# Patient Record
Sex: Male | Born: 1955 | Race: White | Hispanic: No | Marital: Married | Smoking: Never smoker
Health system: Southern US, Community
[De-identification: ages and names within clinical notes are randomized; demographics above are authoritative.]

## PROBLEM LIST (undated history)

## (undated) DIAGNOSIS — M8580 Other specified disorders of bone density and structure, unspecified site: Secondary | ICD-10-CM

## (undated) DIAGNOSIS — M109 Gout, unspecified: Secondary | ICD-10-CM

## (undated) DIAGNOSIS — H409 Unspecified glaucoma: Secondary | ICD-10-CM

## (undated) DIAGNOSIS — G709 Myoneural disorder, unspecified: Secondary | ICD-10-CM

## (undated) DIAGNOSIS — N289 Disorder of kidney and ureter, unspecified: Secondary | ICD-10-CM

## (undated) DIAGNOSIS — L570 Actinic keratosis: Secondary | ICD-10-CM

## (undated) DIAGNOSIS — T7840XA Allergy, unspecified, initial encounter: Secondary | ICD-10-CM

## (undated) DIAGNOSIS — Z5189 Encounter for other specified aftercare: Secondary | ICD-10-CM

## (undated) DIAGNOSIS — Q613 Polycystic kidney, unspecified: Secondary | ICD-10-CM

## (undated) DIAGNOSIS — M859 Disorder of bone density and structure, unspecified: Secondary | ICD-10-CM

## (undated) DIAGNOSIS — I10 Essential (primary) hypertension: Secondary | ICD-10-CM

## (undated) DIAGNOSIS — H269 Unspecified cataract: Secondary | ICD-10-CM

## (undated) DIAGNOSIS — C801 Malignant (primary) neoplasm, unspecified: Secondary | ICD-10-CM

## (undated) DIAGNOSIS — K429 Umbilical hernia without obstruction or gangrene: Secondary | ICD-10-CM

## (undated) DIAGNOSIS — K219 Gastro-esophageal reflux disease without esophagitis: Secondary | ICD-10-CM

## (undated) DIAGNOSIS — R011 Cardiac murmur, unspecified: Secondary | ICD-10-CM

## (undated) DIAGNOSIS — E785 Hyperlipidemia, unspecified: Secondary | ICD-10-CM

## (undated) HISTORY — PX: KIDNEY TRANSPLANT: SHX239

## (undated) HISTORY — DX: Unspecified glaucoma: H40.9

## (undated) HISTORY — DX: Umbilical hernia without obstruction or gangrene: K42.9

## (undated) HISTORY — PX: NEPHRECTOMY TRANSPLANTED ORGAN: SUR880

## (undated) HISTORY — DX: Hypomagnesemia: E83.42

## (undated) HISTORY — DX: Gout, unspecified: M10.9

## (undated) HISTORY — PX: CHOLECYSTECTOMY: SHX55

## (undated) HISTORY — DX: Hyperlipidemia, unspecified: E78.5

## (undated) HISTORY — DX: Myoneural disorder, unspecified: G70.9

## (undated) HISTORY — PX: AV FISTULA PLACEMENT: SHX1204

## (undated) HISTORY — DX: Other specified disorders of bone density and structure, unspecified site: M85.80

## (undated) HISTORY — DX: Polycystic kidney, unspecified: Q61.3

## (undated) HISTORY — DX: Unspecified cataract: H26.9

## (undated) HISTORY — PX: BOWEL RESECTION: SHX1257

## (undated) HISTORY — DX: Disorder of bone density and structure, unspecified: M85.9

## (undated) HISTORY — DX: Cardiac murmur, unspecified: R01.1

## (undated) HISTORY — DX: Gastro-esophageal reflux disease without esophagitis: K21.9

## (undated) HISTORY — PX: SMALL INTESTINE SURGERY: SHX150

## (undated) HISTORY — DX: Allergy, unspecified, initial encounter: T78.40XA

## (undated) HISTORY — DX: Essential (primary) hypertension: I10

## (undated) HISTORY — PX: EYE SURGERY: SHX253

## (undated) HISTORY — DX: Malignant (primary) neoplasm, unspecified: C80.1

## (undated) HISTORY — DX: Encounter for other specified aftercare: Z51.89

## (undated) HISTORY — DX: Actinic keratosis: L57.0

---

## 2002-03-15 ENCOUNTER — Inpatient Hospital Stay (HOSPITAL_COMMUNITY): Admission: AD | Admit: 2002-03-15 | Discharge: 2002-03-16 | Payer: Self-pay | Admitting: Family Medicine

## 2002-03-16 ENCOUNTER — Encounter: Payer: Self-pay | Admitting: Family Medicine

## 2002-03-16 ENCOUNTER — Encounter: Payer: Self-pay | Admitting: Cardiology

## 2003-11-30 ENCOUNTER — Ambulatory Visit (HOSPITAL_COMMUNITY): Admission: RE | Admit: 2003-11-30 | Discharge: 2003-11-30 | Payer: Self-pay | Admitting: Family Medicine

## 2004-10-15 ENCOUNTER — Ambulatory Visit (HOSPITAL_COMMUNITY): Admission: RE | Admit: 2004-10-15 | Discharge: 2004-10-15 | Payer: Self-pay | Admitting: Nephrology

## 2004-11-13 ENCOUNTER — Ambulatory Visit: Payer: Self-pay | Admitting: Cardiology

## 2004-12-04 ENCOUNTER — Ambulatory Visit: Payer: Self-pay

## 2005-02-20 ENCOUNTER — Ambulatory Visit (HOSPITAL_COMMUNITY): Admission: RE | Admit: 2005-02-20 | Discharge: 2005-02-20 | Payer: Self-pay | Admitting: Vascular Surgery

## 2006-03-18 ENCOUNTER — Ambulatory Visit (HOSPITAL_COMMUNITY): Admission: RE | Admit: 2006-03-18 | Discharge: 2006-03-18 | Payer: Self-pay | Admitting: Nephrology

## 2006-11-24 ENCOUNTER — Ambulatory Visit: Payer: Self-pay

## 2006-11-24 ENCOUNTER — Encounter (INDEPENDENT_AMBULATORY_CARE_PROVIDER_SITE_OTHER): Payer: Self-pay | Admitting: Critical Care Medicine

## 2006-11-24 ENCOUNTER — Encounter: Payer: Self-pay | Admitting: Cardiovascular Disease

## 2006-11-26 ENCOUNTER — Ambulatory Visit: Payer: Self-pay | Admitting: Gastroenterology

## 2006-12-16 ENCOUNTER — Encounter: Payer: Self-pay | Admitting: Gastroenterology

## 2006-12-16 ENCOUNTER — Ambulatory Visit: Payer: Self-pay | Admitting: Gastroenterology

## 2008-09-18 ENCOUNTER — Emergency Department (HOSPITAL_COMMUNITY): Admission: EM | Admit: 2008-09-18 | Discharge: 2008-09-18 | Payer: Self-pay | Admitting: Emergency Medicine

## 2008-10-24 ENCOUNTER — Ambulatory Visit (HOSPITAL_COMMUNITY): Admission: RE | Admit: 2008-10-24 | Discharge: 2008-10-24 | Payer: Self-pay | Admitting: Nephrology

## 2009-01-30 ENCOUNTER — Encounter: Admission: RE | Admit: 2009-01-30 | Discharge: 2009-01-30 | Payer: Self-pay | Admitting: Nephrology

## 2009-08-21 ENCOUNTER — Encounter: Payer: Self-pay | Admitting: Cardiology

## 2009-08-22 ENCOUNTER — Encounter: Payer: Self-pay | Admitting: Cardiology

## 2009-10-18 DIAGNOSIS — I10 Essential (primary) hypertension: Secondary | ICD-10-CM | POA: Insufficient documentation

## 2009-10-18 DIAGNOSIS — K219 Gastro-esophageal reflux disease without esophagitis: Secondary | ICD-10-CM | POA: Insufficient documentation

## 2009-10-18 DIAGNOSIS — E785 Hyperlipidemia, unspecified: Secondary | ICD-10-CM | POA: Insufficient documentation

## 2009-10-18 DIAGNOSIS — M109 Gout, unspecified: Secondary | ICD-10-CM | POA: Insufficient documentation

## 2009-10-19 ENCOUNTER — Ambulatory Visit: Payer: Self-pay | Admitting: Cardiology

## 2009-10-19 ENCOUNTER — Encounter (INDEPENDENT_AMBULATORY_CARE_PROVIDER_SITE_OTHER): Payer: Self-pay | Admitting: *Deleted

## 2009-10-19 DIAGNOSIS — R0602 Shortness of breath: Secondary | ICD-10-CM | POA: Insufficient documentation

## 2009-10-19 DIAGNOSIS — R002 Palpitations: Secondary | ICD-10-CM | POA: Insufficient documentation

## 2009-10-30 ENCOUNTER — Ambulatory Visit: Payer: Self-pay | Admitting: Cardiology

## 2009-10-30 ENCOUNTER — Encounter: Payer: Self-pay | Admitting: Cardiology

## 2009-11-08 ENCOUNTER — Ambulatory Visit: Payer: Self-pay | Admitting: Cardiology

## 2009-11-21 ENCOUNTER — Ambulatory Visit: Payer: Self-pay | Admitting: Cardiology

## 2009-11-23 ENCOUNTER — Ambulatory Visit: Payer: Self-pay | Admitting: Cardiology

## 2009-11-23 ENCOUNTER — Encounter: Payer: Self-pay | Admitting: Cardiology

## 2009-11-29 ENCOUNTER — Encounter: Payer: Self-pay | Admitting: Cardiology

## 2010-01-29 ENCOUNTER — Encounter: Payer: Self-pay | Admitting: Cardiology

## 2010-06-01 ENCOUNTER — Encounter: Payer: Self-pay | Admitting: Cardiology

## 2010-09-08 ENCOUNTER — Encounter: Payer: Self-pay | Admitting: Nephrology

## 2010-09-18 NOTE — Assessment & Plan Note (Signed)
Summary: f/u cardionet [place]   Visit Type:  Follow-up Primary Provider:  Dr. Fayrene Fearing Deterding  CC:  Dyspnea and dizziness.  History of Present Illness: The patient presents for followup of the above. Since I last saw him he continues to have the same complaints of dizziness particularly when he does something like climb a flight of stairs.  He feels palpitations with this type of activity but has had no presyncope or syncope. He gets dyspnea more than he thinks he should with exertion. He does not get chest pressure, neck or arm discomfort. He does have some lower extremity swelling perhaps more than when I last saw him.  He did have an exercise treadmill test and I reviewed this with him. He had a reasonable exercise tolerance though he had an accelerated blood pressure response. There were no ischemic ST-T wave changes. I also reviewed an event monitor which she is currently wearing. He Reece Leader has sinus rhythm. He did not have any symptoms while wearing this. It was a short run of nonsustained ventricular tachycardia which apparently was asymptomatic.  Preventive Screening-Counseling & Management  Alcohol-Tobacco     Smoking Status: never  Current Medications (verified): 1)  Prograf 1 Mg Caps (Tacrolimus) .... Take 1 Capsule By Mouth At Bedtime 2)  Cellcept 250 Mg Caps (Mycophenolate Mofetil) .... Take 1 Capsule By Mouth Two Times A Day 3)  Prednisone 5 Mg Tabs (Prednisone) .... Take 2 Tablets By Mouth Once Daily 4)  Aspirin 81 Mg Tbec (Aspirin) .... Take One Tablet By Mouth Daily 5)  Furosemide 20 Mg Tabs (Furosemide) .... Take 2 Tablets By Mouth Daily. 6)  Nexium 40 Mg Cpdr (Esomeprazole Magnesium) .... Take 1 Tablet By Mouth Once A Day 7)  Allopurinol 300 Mg Tabs (Allopurinol) .... Take 1 Tablet By Mouth Once A Day 8)  Lipitor 20 Mg Tabs (Atorvastatin Calcium) .... Take One Tablet By Mouth Daily. 9)  Calcitriol 0.5 Mcg Caps (Calcitriol) .... Take 1 Tablet By Mouth Once A Day 10)   Fosamax 70 Mg Tabs (Alendronate Sodium) .... Take 1 Tablet By Mouth Once Per Week 11)  Amlodipine Besylate 5 Mg Tabs (Amlodipine Besylate) .... Take One Tablet By Mouth Daily 12)  Vicodin Es 7.5-750 Mg Tabs (Hydrocodone-Acetaminophen) .... Take 1 Tablet Every 4 Hours As Needed Pain 13)  Prograf 0.5 Mg Caps (Tacrolimus) .... Take 1capsule By Mouth Every Morning  Allergies: 1)  Augmentin (Amoxicillin-Pot Clavulanate)  Comments:  Nurse/Medical Assistant: The patient's medications were reviewed with the patient and were updated in the Medication List. Pt brought a list of medications to office visit.  Cyril Loosen, RN, BSN (November 21, 2009 10:04 AM)  Past History:  Past Medical History: Reviewed history from 10/19/2009 and no changes required. Hypertension x years GERD Hyperlipidemia x years Gout Polycystic kidney diease Low bone density Hypomagnesemia Umbilical hernia Keratosis on the face  Past Surgical History: Reviewed history from 10/19/2009 and no changes required. Renal transplant (cadaver) AV fistula  Review of Systems       As stated in the HPI and negative for all other systems.   Vital Signs:  Patient profile:   55 year old male Height:      72 inches Weight:      209.25 pounds Pulse rate:   73 / minute BP sitting:   152 / 79  (left arm) Cuff size:   regular  Vitals Entered By: Cyril Loosen, RN, BSN (November 21, 2009 10:00 AM) CC: Dyspnea, dizziness Comments  Follow up visit. Pt wearing Cardionet monitor.    Physical Exam  General:  Well developed, well nourished, in no acute distress. Head:  normocephalic and atraumatic Eyes:  PERRLA/EOM intact; conjunctiva and lids normal. Mouth:  Edentulous. Oral mucosa normal. Neck:  Neck supple, no JVD. No masses, thyromegaly or abnormal cervical nodes. Chest Wall:  no deformities or breast masses noted Lungs:  Clear bilaterally to auscultation and percussion. Abdomen:  Bowel sounds positive; abdomen soft and  non-tender no hernias noted. No hepatosplenomegaly, bilateral palpable kidneys! Msk:  Back normal, normal gait. Muscle strength and tone normal. Extremities:  No clubbing or cyanosis. Neurologic:  Alert and oriented x 3. Skin:  Intact without lesions or rashes. Psych:  Normal affect.   Detailed Cardiovascular Exam  Neck    Carotids: Carotids full and equal bilaterally without bruits.      Neck Veins: Normal, no JVD.    Heart    Inspection: no deformities or lifts noted.      Palpation: normal PMI with no thrills palpable.      Auscultation: regular rate and rhythm, S1, S2 without murmurs, rubs, gallops, or clicks.    Vascular    Abdominal Aorta: no palpable masses, pulsations, or audible bruits.      Femoral Pulses: normal femoral pulses bilaterally.      Pedal Pulses: normal pedal pulses bilaterally.      Radial Pulses: normal radial pulses bilaterally.      Peripheral Circulation: no clubbing, cyanosis, moderate bilateral edema to mid calf   Impression & Recommendations:  Problem # 1:  DYSPNEA (ICD-786.05) The etiology of this is still not clear. He was not particularly symptomatic on the treadmill though he said he was for a while afterwards. He recently had his Lasix increased because of edema and increased blood pressure. He also has been noted to have an elevated TSH. This is going to be addressed by his nephrologist. Maryclare Labrador see if this change in therapy or potential treatment of thyroid disorder helps. Because of the dyspnea and the ectopy on the monitor I will order an echocardiogram particularly looking for LVH with diastolic dysfunction as I don't suspect systolic dysfunction. Orders: 2-D Echocardiogram (2D Echo)  Problem # 2:  PALPITATIONS, RECURRENT (ICD-785.1) He will continue to wear the event monitor. With the ventricular arrhythmias I might change medical therapy or suggest further evaluation if there are any abnormalities on the echo. For now he will remain on the  meds as listed. Orders: 2-D Echocardiogram (2D Echo)  Problem # 3:  HYPERTENSION (ICD-401.9) His blood pressure is usually well controlled at home by his report. He is starting to take the blood pressures more frequently and I gave him specific instructions on a blood pressure diary which we can review to guide further therapy. Hypertensive response to activity might well be contributing to his dyspnea. As above he had his Lasix increased recently.  Patient Instructions: 1)  Your physician has requested that you have an echocardiogram.  Echocardiography is a painless test that uses sound waves to create images of your heart. It provides your doctor with information about the size and shape of your heart and how well your heart's chambers and valves are working.  This procedure takes approximately one hour. There are no restrictions for this procedure. 2)  Your physician recommends that you schedule a follow-up appointment in: May 11th 2011 @10 :45am with Dr. Antoine Poche. 3)  Your physician recommends that you continue on your current medications as directed. Please refer  to the Current Medication list given to you today.

## 2010-09-18 NOTE — Assessment & Plan Note (Signed)
Summary: ec6/palps/last seen in 06 by Sudiksha Victor   Visit Type:  Initial Consult Primary Provider:  Dr. Fayrene Fearing Deterding  CC:  Palpitations.  History of Present Illness: The patient presents for evaluation of dyspnea, lightheadedness and palpitations. The patient has no prior cardiac history. He has had stress tests. I was able to review these. The most recent was in 2008. This was a stress echo and there was no evidence for ischemia. He had a well-preserved ejection fraction. This was done prior to renal transplant. Since that time he has had no further cardiovascular evaluation or problems. However, recently he has noticed with walking that he will develop a "fluttering" in his chest. He notices that climbing a flight of stairs or doing similar activity he gets a little lightheaded and has a slightly abnormal heart rate. He says things go slightly gray. He's never passed out. Symptoms last for about 30 seconds and will go away when he stops. He does not get chest pressure, neck or arm discomfort. He does have occasional resting palpitations but these are infrequent.  Preventive Screening-Counseling & Management  Alcohol-Tobacco     Smoking Status: never  Current Medications (verified): 1)  Prograf 1 Mg Caps (Tacrolimus) .... Take 1 Capsule By Mouth At Bedtime 2)  Cellcept 250 Mg Caps (Mycophenolate Mofetil) .... Take 1 Capsule By Mouth Two Times A Day 3)  Prednisone 5 Mg Tabs (Prednisone) .... Take 2 Tablets By Mouth Once Daily 4)  Bactrim 400-80 Mg Tabs (Sulfamethoxazole-Trimethoprim) .... Take 1 Tablet By Mouth On Mon, Wed and Fri As Directed. 5)  Aspirin 81 Mg Tbec (Aspirin) .... Take One Tablet By Mouth Daily 6)  Furosemide 20 Mg Tabs (Furosemide) .... Take One Tablet By Mouth Daily. 7)  Nexium 40 Mg Cpdr (Esomeprazole Magnesium) .... Take 1 Tablet By Mouth Once A Day 8)  Allopurinol 300 Mg Tabs (Allopurinol) .... Take 1 Tablet By Mouth Once A Day 9)  Lipitor 20 Mg Tabs (Atorvastatin  Calcium) .... Take One Tablet By Mouth Daily. 10)  Calcitriol 0.5 Mcg Caps (Calcitriol) .... Take 1 Tablet By Mouth Once A Day 11)  Fosamax 70 Mg Tabs (Alendronate Sodium) .... Take 1 Tablet By Mouth Once Per Week 12)  Amlodipine Besylate 5 Mg Tabs (Amlodipine Besylate) .... Take One Tablet By Mouth Daily 13)  Vicodin Es 7.5-750 Mg Tabs (Hydrocodone-Acetaminophen) .... Take 1 Tablet Every 4 Hours As Needed Pain 14)  Prograf 0.5 Mg Caps (Tacrolimus) .... Take 1capsule By Mouth Every Morning  Allergies: 1)  Augmentin (Amoxicillin-Pot Clavulanate)  Comments:  Nurse/Medical Assistant: The patient's medications were reviewed with the patient and were updated in the Medication List. Pt brought a list of medications to office visit.  Cyril Loosen, RN, BSN (October 19, 2009 11:41 AM)  Past History:  Past Medical History: Hypertension x years GERD Hyperlipidemia x years Gout Polycystic kidney diease Low bone density Hypomagnesemia Umbilical hernia Keratosis on the face  Past Surgical History: Renal transplant (cadaver) AV fistula  Family History: Family history of polycystic kidney disease.  No early heart disease  Social History: Married Children Never smokedSmoking Status:  never  Review of Systems       As stated in the HPI and negative for all other systems.   Vital Signs:  Patient profile:   55 year old male Height:      72 inches Weight:      200 pounds BMI:     27.22 Pulse rate:   74 / minute BP sitting:  132 / 82  (left arm) Cuff size:   regular  Vitals Entered By: Cyril Loosen, RN, BSN (October 19, 2009 11:36 AM)  Nutrition Counseling: Patient's BMI is greater than 25 and therefore counseled on weight management options. CC: Palpitations Comments Pt states he has palpitations, usually with exertion. Pt states this may occur every 3-4 days or so.   Physical Exam  General:  Well developed, well nourished, in no acute distress. Head:  normocephalic and  atraumatic Eyes:  PERRLA/EOM intact; conjunctiva and lids normal. Mouth:  Teeth, gums and palate normal. Oral mucosa normal. Neck:  Neck supple, no JVD. No masses, thyromegaly or abnormal cervical nodes, transmitted bruit Chest Wall:  no deformities or breast masses noted Lungs:  Clear bilaterally to auscultation and percussion. Abdomen:  Bowel sounds positive; abdomen soft and non-tender without masses, organomegaly, or hernias noted. No hepatosplenomegaly, bilateral palpable kidneys! Msk:  Back normal, normal gait. Muscle strength and tone normal. Extremities:  palpable thrill and bruit at the right upper arm dialysis fistula with transmission of a machine like systolic diastolic murmur throughout his chest and into the carotids, mild bilateral lower extremity edema left greater than right Neurologic:  Alert and oriented x 3. Skin:  Intact without lesions or rashes. Cervical Nodes:  no significant adenopathy Axillary Nodes:  no significant adenopathy Inguinal Nodes:  no significant adenopathy Psych:  Normal affect.   Detailed Cardiovascular Exam  Neck    Carotids: Carotids full and equal bilaterally without bruits.      Neck Veins: Normal, no JVD.    Heart    Inspection: no deformities or lifts noted.      Palpation: normal PMI with no thrills palpable.      Auscultation: regular rate and rhythm, S1, S2 without murmurs, rubs, gallops, or clicks.    Vascular    Abdominal Aorta: no palpable masses, pulsations, or audible bruits.      Femoral Pulses: normal femoral pulses bilaterally.      Pedal Pulses: normal pedal pulses bilaterally.      Radial Pulses: normal radial pulses bilaterally.      Peripheral Circulation: no clubbing, cyanosis, or edema noted with normal capillary refill.     EKG  Procedure date:  10/19/2009  Findings:      sinus rhythm, rate 71, axis within normal limits, intervals within normal limits, no acute ST-T wave changes.  Impression &  Recommendations:  Problem # 1:  PALPITATIONS, RECURRENT (ICD-785.1) I will apply an event monitor. We will review the patient's extensive history to look for the last TSH. Other labs have been normal. Finally he will have an exercise treadmill test as described below. Orders: EKG w/ Interpretation (93000) GXT (GXT) Cardionet/Event Monitor (Cardionet/Event)  Problem # 2:  DYSPNEA (ICD-786.05) The patient is describing some dizziness and dyspnea with exertion. I don't strongly suspect obstructive coronary disease. I wonder what his blood pressure and rhythm are doing with exercise. Also with his prominent physical findings could there be a vascular steal phenomenon contributing related to his dialysis fistula. I will begin this assessment with an exercise treadmill test looking for obstructive coronary disease, fluctuations in blood pressure unexpected or arrhythmias.  Problem # 3:  HYPERLIPIDEMIA (ICD-272.4) I reviewed the last profile (Jan 2011) LDL 98, HDL 39, Trig 106.  No change in therapy is indicated.  Patient Instructions: 1)  Your physician recommends that you schedule a follow-up appointment in: APRIL 5TH 2011 @10 :00AM WITH DR. Amori Colomb. 2)  Your physician has recommended that  you wear an event monitor called CARDIONET.  Event monitors are medical devices that record the heart's electrical activity. Doctors most often use these monitors to diagnose arrhythmias. Arrhythmias are problems with the speed or rhythm of the heartbeat. The monitor is a small, portable device. You can wear one while you do your normal daily activities. This is usually used to diagnose what is causing palpitations/syncope (passing out). 3)  Your physician has requested that you have an exercise tolerance test.  For further information please visit [email or web address]  Please also follow instruction sheet, as given.

## 2010-09-18 NOTE — Letter (Signed)
Summary: External Correspondence/ NOTE [facility]  External Correspondence/ NOTE [facility]   Imported By: Dorise Hiss 09/22/2009 11:03:13  _____________________________________________________________________  External Attachment:    Type:   Image     Comment:   External Document

## 2010-09-18 NOTE — Consult Note (Signed)
Summary: [facility] Return  [facility] Return   Imported By: Harlon Flor 02/02/2010 10:25:12  _____________________________________________________________________  External Attachment:    Type:   Image     Comment:   External Document

## 2010-09-18 NOTE — Letter (Signed)
Summary: [facility] Return Patient   [facility] Renal Transplant Return Patient   Imported By: Roderic Ovens 06/20/2010 15:34:30  _____________________________________________________________________  External Attachment:    Type:   Image     Comment:   External Document

## 2010-09-18 NOTE — Letter (Signed)
Summary: Graded Exercise Tolerance Test  [facility] [address]   [place], [place] [address]   Phone: [phone]  Fax: [phone]      [facility] Cardiovascular Services  Graded Exercise Tolerance Test    [facility]  Appointment Date:_  Appointment Time:_   Your doctor has ordered a stress test to help determine the condition of your  heart during exercise. If you take blood pressure medicine , ask your doctor if you should take it the day of your test.  You may take your meds the day of your test. You may eat a light meal before your test.  Please be sure to bring the copy of your order with you.   You should dress comfortably, for example: Sweat pants, shorts, or skirt, Loose                                                                                                       short sleeved T-shirt, Rubber soled lace-up shoes (tennis shoes)  You will need to arrive 15 minutes before your appointment time. You will also need to enter at the Main Entrance of the hospital and go to the registration desk. They will direct you to the Cardiovascular Department on the third floor.  You will need to plan on being at the hospital for one hour from registration for this appointment.

## 2010-09-18 NOTE — Medication Information (Signed)
Summary: RX Folder/ MED LIST DR. Odie Sera Folder/ MED LIST DR. DETERDING   Imported By: Dorise Hiss 09/22/2009 11:11:22  _____________________________________________________________________  External Attachment:    Type:   Image     Comment:   External Document

## 2010-09-18 NOTE — Procedures (Signed)
Summary: Holter and Event/ CARDIONET END OF SERVICE SUMMARY REPORT  Holter and Event/ CARDIONET END OF SERVICE SUMMARY REPORT   Imported By: Dorise Hiss 12/12/2009 08:17:26  _____________________________________________________________________  External Attachment:    Type:   Image     Comment:   External Document  Appended Document: Holter and Event/ CARDIONET END OF SERVICE SUMMARY REPORT Patient informed of the above.

## 2011-01-01 NOTE — Consult Note (Signed)
NAMEKEIGEN, CADDELL                 ACCOUNT NO.:  [id number]   MEDICAL RECORD NO.:  [id number]          PATIENT TYPE:  EMS   LOCATION:  ED                            FACILITY:  [facility]   PHYSICIAN:  J. Darreld Mclean, M.D. DATE OF BIRTH:  1955-09-08   DATE OF CONSULTATION:  DATE OF DISCHARGE:  09/18/2008                                 CONSULTATION   The patient seen at the request of the emergency room.   The patient fell today on the snow and ice and injured both wrists.  X-  rays show a nondisplaced fracture of both distal radii.  He had no head  injury, no other injury.  X-rays show a subtle fracture nondisplaced of  the distal radius on the right and a small nondisplaced intraarticular  fracture of the left distal radius.  Neurovascular is intact.   IMPRESSION:  Bilateral radial fracture.   We placed bilateral sugar tong splints.  Instructions given for care.  He has an allergy to PENICILLIN.  I gave him hydrocodone for pain.  I  will see him in the office in 2-3 days.  Our office may be closed  tomorrow because of the bad weather.  Any difficulty, come back to the  emergency room.           ______________________________  Shela Commons. Darreld Mclean, M.D.     JWK/MEDQ  D:  09/18/2008  T:  09/19/2008  Job:  [id number]

## 2011-01-04 NOTE — Procedures (Signed)
   NAME:  LAYMOND, POSTLE                           ACCOUNT NO.:  [id number]   MEDICAL RECORD NO.:  [id number]                   PATIENT TYPE:  INP   LOCATION:  A216                                 FACILITY:  [facility]   PHYSICIAN:  Donna Bernard, M.D.             DATE OF BIRTH:  02-10-1956   DATE OF PROCEDURE:  03/15/2002  DATE OF DISCHARGE:  03/16/2002                                EKG INTERPRETATION   EKG reveals sinus bradycardia with no significant ST-T changes.  There is  borderline low voltage in the limb leads.                                               Donna Bernard, M.D.    Karie Chimera  D:  05/12/2002  T:  05/13/2002  Job:  [id number]

## 2011-01-04 NOTE — Discharge Summary (Signed)
   NAME:  Scott Macias, Scott Macias                           ACCOUNT NO.:  [id number]   MEDICAL RECORD NO.:  [id number]                   PATIENT TYPE:  INP   LOCATION:  A216                                 FACILITY:  [facility]   PHYSICIAN:  Donna Bernard, M.D.             DATE OF BIRTH:  [birth year]   DATE OF ADMISSION:  03/15/2002  DATE OF DISCHARGE:  03/16/2002                                 DISCHARGE SUMMARY   FINAL DIAGNOSES:  1. Chest pain, myocardial infarction ruled out.  2. Hypertension.  3. Polycystic kidney disease.   FINAL DISPOSITION:  1. Patient discharged home.  2. Patient to maintain all usual medications.  3. Patient to follow up with [place] in the office as scheduled.   HISTORY OF PRESENT ILLNESS:  Please see H&P as dictated.   HOSPITAL COURSE:  This patient is a 55 year old white male with a history of  polycystic kidney disease, renal insufficiency, hypertension,  hyperlipidemia, who presented to the office on the day of admission with  complaints of chest discomfort.  He described them as fairly severe,  substernal in nature.  He described it as a deep, pressure-like sensation,  some shortness of breath.  Due to these symptoms and multiple risk factors,  the patient was admitted to the hospital.  Initial EKG revealed sinus  bradycardia with no significant ST-T changes.  Serial cardiac enzymes were  performed.  These proved to be negative.  The patient was admitted to [facility]  and cardiology consult was obtained.  They recommended Cardiolite stress  test.  This was done and also proved to be negative.  On the day of  discharge the patient was feeling better and discharged home with diet and  disposition as noted above.                                                Donna Bernard, M.D.    Karie Chimera  D:  05/12/2002  T:  05/13/2002  Job:  [id number]

## 2011-01-04 NOTE — Op Note (Signed)
NAMEBRIGHTON, PILLEY                 ACCOUNT NO.:  [id number]   MEDICAL RECORD NO.:  [id number]          PATIENT TYPE:  OIB   LOCATION:  2899                         FACILITY:  [facility]   PHYSICIAN:  Janetta Hora. Fields, MD  DATE OF BIRTH:  Mar 09, 1956   DATE OF PROCEDURE:  02/20/2005  DATE OF DISCHARGE:  02/20/2005                                 OPERATIVE REPORT   PROCEDURE:  Left brachiocephalic arteriovenous fistula.   PREOPERATIVE DIAGNOSIS:  End-stage renal disease.   POSTOPERATIVE DIAGNOSIS:  End-stage renal disease.   ANESTHESIA:  Local with IV sedation.   SURGEON:  Janetta Hora. Fields, M.D.   ASSISTANT:  Rowe Clack, P.A.-C.   OPERATIVE FINDINGS:  1.  A 2.5- to 3-mm cephalic vein.  2.  A 2.5-mm artery.   OPERATIVE DETAILS:  After obtaining informed consent, the patient was taken  to the operating room.  The patient was placed in supine position on the  operating table.  After adequate sedation, the patient's entire left upper  extremity was prepped and draped in the usual sterile fashion.  Local  anesthesia was infiltrated near the antecubital crease.  A transverse  incision was made in this location.  It was carried down through the  subcutaneous tissue and the cephalic vein was dissected free  circumferentially.  It was approximately 2.5 to 3 mm in diameter.  Next, the  artery was dissected free just adjacent to this in the medial portion of the  incision.  This was proximally 2.5 mm in diameter.  The artery was  controlled proximally and distal with Vesseloops.  Next, the patient was  given 5000 units of intravenous heparin.  Vesseloops were pulled taut on the  artery.  A longitudinal arteriotomy was made.  The distal cephalic vein was  ligated and the vein swung over to the level of the artery.  The vein was  controlled proximally with a fine bulldog clamp.  The vein was then  thoroughly flushed with heparinized saline and dilated slightly.  It was  then marked for  orientation.  There vein was then sewn end-of-vein-to-side-  of-artery using running 7-0 Prolene suture.  Just prior to completion of the  anastomosis, this was fore-bled, back-bled and thoroughly flushed.  The  anastomosis was secured, clamps were released and there was a palpable  thrill in the fistula immediately.  Hemostasis was obtained.  Next, the  subcutaneous tissues were approximated using a running 3-0 Vicryl suture.  Skin was then closed with a 4-0 Vicryl subcuticular stitch.  The patient  tolerated the procedure well and there were no immediate complications.  Instrument, sponge and needle count was correct at the end of the case.  The  patient was taken to the recovery room in stable condition.     CEF/MEDQ  D:  03/31/2005  T:  04/01/2005  Job:  [id number]

## 2011-01-04 NOTE — Procedures (Signed)
   NAME:  Scott Macias, Scott Macias                           ACCOUNT NO.:  [id number]   MEDICAL RECORD NO.:  [id number]                   PATIENT TYPE:  INP   LOCATION:  A216                                 FACILITY:  [facility]   PHYSICIAN:  Donna Bernard, M.D.             DATE OF BIRTH:  [birth year]   DATE OF PROCEDURE:  03/16/2002  DATE OF DISCHARGE:  03/16/2002                                EKG INTERPRETATION   EKG reveals sinus bradycardia with nonspecific ST-T changes and borderline  low voltage.                                               Donna Bernard, M.D.    Karie Chimera  D:  05/12/2002  T:  05/13/2002  Job:  [id number]

## 2011-01-04 NOTE — Assessment & Plan Note (Signed)
[facility]                         GASTROENTEROLOGY OFFICE NOTE   NAME:Scott Macias, Scott Macias                        MRN:          [id number]  DATE:11/26/2006                            DOB:          [birth year]    REASON FOR REFERRAL:  Colorectal cancer screening.   HISTORY OF PRESENT ILLNESS:  Scott Macias is a very nice 55 year old white  male referred through the courtesy of Dr. Darrick Penna.  He is on the renal  transplant list at [facility] for ESRD due to  polycystic kidney disease, and has been on hemodialysis for about 18  months.  He performs hemodialysis at home on Mondays, Wednesdays, and  Fridays.  He has no colorectal symptoms, and specifically denies any  change in bowel habits, change in stool caliber, constipation, diarrhea,  melena, hematochezia, abdominal pain, rectal pain, or weight loss.  He  does have GERD, and his symptoms are under very good control on daily  Nexium.  He notes no dysphagia, odynophagia, nausea, or vomiting.  He  states he had a routine stress test performed several days ago at  [facility], and the results are pending at the time of this  dictation.  No family history of colon cancer or colon polyps.  He does  have an aunt with Crohn's disease.   PAST MEDICAL HISTORY:  1. Hypertension.  2. Polycystic kidney disease on hemodialysis x18 months.  3. History of kidney stones.  4. Gout.  5. Anemia of chronic disease.  6. Secondary hyperparathyroidism.  7. Family history of polycystic kidney disease.   CURRENT MEDICATIONS:  Listed on the chart, updated and reviewed.   MEDICATION ALLERGIES:  AUGMENTIN LEADING TO DIARRHEA.   Social history and review of systems per the handwritten form.   PHYSICAL EXAMINATION:  Well-developed, well-nourished, white male in no  acute distress.  Height 6 feet.  Weight 185.4 pounds.  Blood pressure is 114/70.  Pulse  76 and regular.  HEENT EXAM:  Anicteric sclerae.   Oropharynx clear.  CHEST:  Clear to auscultation bilaterally.  CARDIAC:  Regular rate and rhythm without murmurs appreciated.  ABDOMEN:  Soft, non-tender, non-distended.  Normoactive bowel sounds.  No palpable organomegaly, masses, or hernias.  RECTAL:  Deferred to time of colonoscopy.  EXTREMITIES:  Without clubbing, cyanosis, or edema.  NEUROLOGIC:  Alert and oriented x3.  Grossly nonfocal.   ASSESSMENT AND PLAN:  Colorectal cancer screening.  Average risk for  colon cancer.  Renal transplant candidate.  Risks, benefits, and  alternatives to colonoscopy with possible biopsy and possible  polypectomy discussed with the patient, and he consents to proceed.  This will be scheduled electively on a Tuesday or Thursday.     Venita Lick. Russella Dar, MD, [facility]  Electronically Signed    MTS/MedQ  DD: 11/26/2006  DT: 11/26/2006  Job #: [id number]   cc:   Fayrene Fearing L. Deterding, M.D.

## 2011-01-04 NOTE — H&P (Signed)
NAME:  Macias, Scott                           ACCOUNT NO.:  [id number]   MEDICAL RECORD NO.:  [id number]                   PATIENT TYPE:  INP   LOCATION:  A216                                 FACILITY:  [facility]   PHYSICIAN:  Donna Bernard, M.D.             DATE OF BIRTH:  Dec 23, 1955   DATE OF ADMISSION:  03/15/2002  DATE OF DISCHARGE:  03/16/2002                                HISTORY & PHYSICAL   CHIEF COMPLAINT:  Chest discomfort.   SUBJECTIVE:  This patient is a 55 year old white male with a history of  polycystic kidney disease and accompanying renal insufficiency,  hypertension, hyperlipidemia, and family history of premature coronary  artery disease who presents to the office the day of admission with  complaints of chest discomfort.  The patient describes his chest discomfort  as pressure like pain.  He did have slight shortness of breath with it and  no significant nausea or diaphoresis.  No significant radiation.  The  discomfort occurred this morning while at work.  He has had occasional  reflux symptoms recently, but not particularly significant.  The patient  noted the discomfort for a couple three hours at work and then called [place] and  came on to the office.  Now he states the discomfort is relatively mild.  The patient notes compliance with his medications.   MEDICATIONS:  1. Cozaar 50 mg one daily.  2. Hydrochlorothiazide 25 one-half daily.  3. Metoprolol 50 one-half b.i.d.   PAST SURGICAL HISTORY:  None.   FAMILY HISTORY:  Positive for hypertension, diabetes, coronary artery  disease, polycystic kidney disease on his father's side.   ALLERGIES:  ACE INHIBITOR intolerance, AUGMENTIN.   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  No tobacco abuse.  No alcohol abuse.  One child.   REVIEW OF SYMPTOMS:  Otherwise none.   PHYSICAL EXAMINATION:  VITAL SIGNS:  Afebrile.  Blood pressure 124/86,  weight 213.  GENERAL:  The patient is alert, slightly anxious, in no acute distress.  HEENT:  Normal.  NECK:  Supple.  No JVD.  LUNGS:  Clear.  HEART:  Regular rate and rhythm.  No chest wall tenderness.  No significant  murmurs.  ABDOMEN:  Soft.  No masses.  No rebound.  No guarding.  NEUROLOGIC:  Intact.  SKIN:  Normal.   LABORATORIES:  EKG with normal sinus rhythm.  No significant ST-T changes  noted.   IMPRESSION:  1. Chest pain.  Somewhat atypical chest discomfort accompanied by     significant risk factors including 2, 3, 4.  2. Hypertension, 12 years' duration.  3. Hyperlipidemia with low HDL.  4. Family history of premature coronary artery disease.  5. Polycystic kidney disease with history of renal insufficiency.  The     patient is currently followed by nephrology for this.   PLAN:  As  per orders.                                               Vilinda Blanks. Gerda Diss, M.D.    Karie Chimera  D:  03/15/2002  T:  03/18/2002  Job:  [id number]

## 2011-01-04 NOTE — Assessment & Plan Note (Signed)
[facility]                                 ON-CALL NOTE   NAME:LEWISChace, Klippel                   MRN:          [id number]  DATE:12/17/3006                            DOB:          [birth year]    TIME OF CALL:  5:58 p.m.   CALLER:  Patient.   REASON FOR CALL:  Mr. Andria Meuse called this evening with some questions,  after having had a recent colonoscopy.  He underwent colonoscopy 2 days  ago with Dr. Russella Dar.  The patient read the colonoscopy report to me.  Several diminutive polyps were removed in the transverse colon with the  hot biopsy forceps.  The patient has done well without symptoms since.   His principal question is with regards to dialysis.  The patient is a  home dialysis patient and uses heparin as part of that process.  He  wonders if he should use heparin.  It is not clear that he was advised  one way or the other.   RECOMMENDATIONS:  I stated to him that there is always a risk of  bleeding post-polypectomy.  The risk is small and, since he has had no  bleeding, to go ahead with his dialysis as planned.  If he were to  develop any significant bleeding to let us know.   DISPOSITION:  He was appreciative of the advice.     Wilhemina Bonito. Marina Goodell, MD  Electronically Signed    JNP/MedQ  DD: 12/17/2006  DT: 12/18/2006  Job #: [id number]   cc:   Venita Lick. Russella Dar, MD, [facility]

## 2011-11-04 ENCOUNTER — Encounter: Payer: Self-pay | Admitting: Gastroenterology

## 2012-02-12 ENCOUNTER — Encounter: Payer: Self-pay | Admitting: *Deleted

## 2012-11-25 ENCOUNTER — Encounter: Payer: Self-pay | Admitting: Gastroenterology

## 2014-09-07 ENCOUNTER — Telehealth: Payer: Self-pay | Admitting: Family Medicine

## 2014-09-07 NOTE — Telephone Encounter (Signed)
Patient notified and verbalized understanding. 

## 2014-09-07 NOTE — Telephone Encounter (Signed)
Pt had emergency surgery in [facility], lives in [place] needs home health  Set up in [place] but [place] can't do it due to not being a neighboring state  I've advised him since we have not seen him since 2007 and we have  Not OV notes or documentation to go by that we may not be able to do this Without an OV here with notes from his Doc in [facility].   Pt is still in the [facility] area, with [facility] seeing him there at this point.   Please advise

## 2014-09-07 NOTE — Telephone Encounter (Signed)
Sorry, no way we can authorize this, pt needs local doc to authorize (because they in turn are the one's required by law to monitor the pt). Clearly not seen here for 8 yrs pt has decided to not keep [place] as his family doc

## 2015-02-06 ENCOUNTER — Ambulatory Visit (INDEPENDENT_AMBULATORY_CARE_PROVIDER_SITE_OTHER): Payer: Medicare Other | Admitting: Family Medicine

## 2015-02-06 ENCOUNTER — Encounter: Payer: Self-pay | Admitting: Family Medicine

## 2015-02-06 VITALS — BP 100/70 | Ht 72.0 in | Wt 211.0 lb

## 2015-02-06 DIAGNOSIS — Z125 Encounter for screening for malignant neoplasm of prostate: Secondary | ICD-10-CM | POA: Diagnosis not present

## 2015-02-06 DIAGNOSIS — I10 Essential (primary) hypertension: Secondary | ICD-10-CM | POA: Diagnosis not present

## 2015-02-06 DIAGNOSIS — Z940 Kidney transplant status: Secondary | ICD-10-CM | POA: Diagnosis not present

## 2015-02-06 DIAGNOSIS — I340 Nonrheumatic mitral (valve) insufficiency: Secondary | ICD-10-CM | POA: Diagnosis not present

## 2015-02-06 DIAGNOSIS — Z0000 Encounter for general adult medical examination without abnormal findings: Secondary | ICD-10-CM

## 2015-02-06 NOTE — Progress Notes (Signed)
   Subjective:    Patient ID: Scott Macias, male    DOB: [birth year], 59 y.o.   MRN: [id number]  HPI The patient comes in today for a wellness visit.    A review of their health history was completed.  A review of medications was also completed.  Any needed refills: none  Eating habits:good  Falls/  MVA accidents in past few months: none  Regular exercise: yes  Specialist pt sees on regular basis: yes, several  Preventative health issues were discussed.   Additional concerns: none  Patient has not had a colonoscopy in the past 5 years. He states his last one was in 2006 or 2007. 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       Positive element of chronic fatigue  HENT: Negative for congestion and rhinorrhea.   Eyes: Negative for discharge.  Respiratory: Negative for cough and wheezing.   Cardiovascular: Negative for chest pain.  Gastrointestinal: Negative for vomiting, abdominal pain and blood in stool.  Genitourinary: Negative for frequency and difficulty urinating.  Musculoskeletal: Negative for neck pain.       Substantial knee pain. Bilateral. Worse after significant effort  Skin: Negative for rash.  Allergic/Immunologic: Negative for environmental allergies and food allergies.  Neurological: Negative for weakness and headaches.  Psychiatric/Behavioral: Negative for agitation.  All other systems reviewed and are negative.      Objective:   Physical Exam  Constitutional: He appears well-developed and well-nourished.  HENT:  Head: Normocephalic and atraumatic.  Right Ear:  External ear normal.  Left Ear: External ear normal.  Nose: Nose normal.  Mouth/Throat: Oropharynx is clear and moist.  Eyes: EOM are normal. Pupils are equal, round, and reactive to light.  Neck: Normal range of motion. Neck supple. No thyromegaly present.  Cardiovascular: Normal rate, regular rhythm and normal heart sounds.   No murmur heard. Positive substantial flow murmur precordial  Pulmonary/Chest: Effort normal and breath sounds normal. No respiratory distress. He has no wheezes.  Abdominal: Soft. Bowel sounds are normal. He exhibits no distension and no mass. There is no tenderness.  Scarring with substantial hernia evident. Chronic open ulceration at incision site  Genitourinary: Penis normal.  Prostate within normal limits  Musculoskeletal: Normal range of motion. He exhibits no edema.  Lymphadenopathy:    He has no cervical adenopathy.  Neurological: He is alert. He exhibits normal muscle tone.  Skin: Skin is warm and dry. No erythema.  Psychiatric: He has a normal mood and affect. His behavior is normal. Judgment normal.  Vitals reviewed.         Assessment & Plan:  Impression 1 well adult exam #2 status post kidney transplant #3 hypertension followed by specialist #4 hyperlipidemia followed by specialist #5 heart murmur. Patient presents echocardiogram results. Mitral valve regurgitation. Discussed at length. Plan diet exercise discussed. Appropriate blood work. Patient to bring by colonoscopy report from 9 or so years ago. WSL

## 2015-02-07 ENCOUNTER — Encounter: Payer: Self-pay | Admitting: Family Medicine

## 2015-02-07 LAB — PSA: PROSTATE SPECIFIC AG, SERUM: 1 ng/mL (ref 0.0–4.0)

## 2015-02-22 ENCOUNTER — Ambulatory Visit (INDEPENDENT_AMBULATORY_CARE_PROVIDER_SITE_OTHER): Payer: Medicare Other | Admitting: Family Medicine

## 2015-02-22 ENCOUNTER — Encounter: Payer: Self-pay | Admitting: Family Medicine

## 2015-02-22 VITALS — BP 120/78 | Temp 98.4°F | Ht 72.0 in | Wt 213.4 lb

## 2015-02-22 DIAGNOSIS — L02219 Cutaneous abscess of trunk, unspecified: Secondary | ICD-10-CM

## 2015-02-22 DIAGNOSIS — L03319 Cellulitis of trunk, unspecified: Secondary | ICD-10-CM

## 2015-02-22 MED ORDER — DOXYCYCLINE HYCLATE 100 MG PO TABS: 100.0000 mg | ORAL_TABLET | Freq: Two times a day (BID) | ORAL | Status: DC

## 2015-02-22 NOTE — Progress Notes (Signed)
   Subjective:    Patient ID: Scott Macias, male    DOB: 1956-08-06, 59 y.o.   MRN: [id number]  HPI Patient is here today because he has a cyst on his chest. Patient states that it is red and very painful. This has been present for over 1 month now. Treatments tried: none  Patient states that he has no other concerns at this time.   Started two wqks ago  Started as a cyst  Tender and swelling and uncomfortable    Review of Systems No headache no chest pain no fever    Objective:   Physical Exam  Alert lungs clear heart regular rhythm chest wall palpable enlarged erythematous sebaceous cyst. Discussed.  Procedure note patient was prepped draped anesthetized incised pus withdrawn      Assessment & Plan:  Impression abscess IND plan [place] twice a day 7 days. Wound wound management discussed

## 2015-03-28 ENCOUNTER — Telehealth: Payer: Self-pay | Admitting: Family Medicine

## 2015-03-28 NOTE — Telephone Encounter (Signed)
Dr. Steve to see 

## 2015-03-28 NOTE — Telephone Encounter (Signed)
Patient seen Dr. Richardson Landry on 02/06/2015 for his wellness exam.  He was also charged for an office visit and his insurance requires a co-pay for this portion.  Patient says that he was not told that he would be charged any thing outside of what was included in the wellness exam.  I explained to him what the notes showed that was talked about. He says that anything that was talked about was discussed because Dr. Richardson Landry brought it up. He does not feel he should have been charged the additional E/M code.  Please advise.

## 2015-04-10 NOTE — Telephone Encounter (Signed)
Sorry this is a new medicare rule, when pts provide the wellness visit and there is substantial disc re chronic problems along with input from the prim care physician,m we have an addtnl chare now which accurately reflects what primary  Care docs are doing at many of the "wellness" visits, that is discusising and documenting and advising pt on sig medical problems, which we did for the pt

## 2015-04-10 NOTE — Telephone Encounter (Signed)
Spoke with patient and informed him per Dr.Steve-Sorry this is a new medicare rule, when pts provide the wellness visit and there is substantial disc re chronic problems along with input from the prim care physician,m we have an addtnl chare now which accurately reflects what primary Care docs are doing at many of the "wellness" visits, that is discusising and documenting and advising pt on sig medical problems, which we did for the pt. Patient verbalized understanding.

## 2015-09-22 DIAGNOSIS — I770 Arteriovenous fistula, acquired: Secondary | ICD-10-CM | POA: Diagnosis not present

## 2015-09-22 DIAGNOSIS — E782 Mixed hyperlipidemia: Secondary | ICD-10-CM | POA: Diagnosis not present

## 2015-09-22 DIAGNOSIS — Z87718 Personal history of other specified (corrected) congenital malformations of genitourinary system: Secondary | ICD-10-CM | POA: Diagnosis not present

## 2015-09-22 DIAGNOSIS — N183 Chronic kidney disease, stage 3 (moderate): Secondary | ICD-10-CM | POA: Diagnosis not present

## 2015-09-22 DIAGNOSIS — Z940 Kidney transplant status: Secondary | ICD-10-CM | POA: Diagnosis not present

## 2015-09-22 DIAGNOSIS — M1A30X0 Chronic gout due to renal impairment, unspecified site, without tophus (tophi): Secondary | ICD-10-CM | POA: Diagnosis not present

## 2015-09-22 DIAGNOSIS — N2581 Secondary hyperparathyroidism of renal origin: Secondary | ICD-10-CM | POA: Diagnosis not present

## 2015-09-22 DIAGNOSIS — I129 Hypertensive chronic kidney disease with stage 1 through stage 4 chronic kidney disease, or unspecified chronic kidney disease: Secondary | ICD-10-CM | POA: Diagnosis not present

## 2015-10-23 DIAGNOSIS — D899 Disorder involving the immune mechanism, unspecified: Secondary | ICD-10-CM | POA: Diagnosis not present

## 2015-10-23 DIAGNOSIS — I10 Essential (primary) hypertension: Secondary | ICD-10-CM | POA: Diagnosis not present

## 2015-10-23 DIAGNOSIS — Z940 Kidney transplant status: Secondary | ICD-10-CM | POA: Diagnosis not present

## 2015-10-24 DIAGNOSIS — M810 Age-related osteoporosis without current pathological fracture: Secondary | ICD-10-CM | POA: Diagnosis not present

## 2015-11-14 DIAGNOSIS — M810 Age-related osteoporosis without current pathological fracture: Secondary | ICD-10-CM | POA: Diagnosis not present

## 2015-11-27 DIAGNOSIS — E785 Hyperlipidemia, unspecified: Secondary | ICD-10-CM | POA: Diagnosis not present

## 2015-11-27 DIAGNOSIS — Z992 Dependence on renal dialysis: Secondary | ICD-10-CM | POA: Diagnosis not present

## 2015-11-27 DIAGNOSIS — Z940 Kidney transplant status: Secondary | ICD-10-CM | POA: Diagnosis not present

## 2015-11-28 DIAGNOSIS — M79672 Pain in left foot: Secondary | ICD-10-CM | POA: Diagnosis not present

## 2015-12-19 DIAGNOSIS — E782 Mixed hyperlipidemia: Secondary | ICD-10-CM | POA: Diagnosis not present

## 2015-12-19 DIAGNOSIS — M1A30X0 Chronic gout due to renal impairment, unspecified site, without tophus (tophi): Secondary | ICD-10-CM | POA: Diagnosis not present

## 2015-12-19 DIAGNOSIS — K5669 Other intestinal obstruction: Secondary | ICD-10-CM | POA: Diagnosis not present

## 2015-12-19 DIAGNOSIS — Z940 Kidney transplant status: Secondary | ICD-10-CM | POA: Diagnosis not present

## 2015-12-19 DIAGNOSIS — M1990 Unspecified osteoarthritis, unspecified site: Secondary | ICD-10-CM | POA: Diagnosis not present

## 2015-12-19 DIAGNOSIS — N183 Chronic kidney disease, stage 3 (moderate): Secondary | ICD-10-CM | POA: Diagnosis not present

## 2015-12-19 DIAGNOSIS — N2581 Secondary hyperparathyroidism of renal origin: Secondary | ICD-10-CM | POA: Diagnosis not present

## 2015-12-19 DIAGNOSIS — I770 Arteriovenous fistula, acquired: Secondary | ICD-10-CM | POA: Diagnosis not present

## 2015-12-19 DIAGNOSIS — I129 Hypertensive chronic kidney disease with stage 1 through stage 4 chronic kidney disease, or unspecified chronic kidney disease: Secondary | ICD-10-CM | POA: Diagnosis not present

## 2015-12-22 DIAGNOSIS — R69 Illness, unspecified: Secondary | ICD-10-CM | POA: Diagnosis not present

## 2015-12-24 DIAGNOSIS — R69 Illness, unspecified: Secondary | ICD-10-CM | POA: Diagnosis not present

## 2016-02-07 DIAGNOSIS — R69 Illness, unspecified: Secondary | ICD-10-CM | POA: Diagnosis not present

## 2016-03-08 DIAGNOSIS — Z940 Kidney transplant status: Secondary | ICD-10-CM | POA: Diagnosis not present

## 2016-03-08 DIAGNOSIS — R319 Hematuria, unspecified: Secondary | ICD-10-CM | POA: Diagnosis not present

## 2016-03-11 DIAGNOSIS — H40013 Open angle with borderline findings, low risk, bilateral: Secondary | ICD-10-CM | POA: Diagnosis not present

## 2016-03-18 DIAGNOSIS — Z79899 Other long term (current) drug therapy: Secondary | ICD-10-CM | POA: Diagnosis not present

## 2016-03-19 DIAGNOSIS — I770 Arteriovenous fistula, acquired: Secondary | ICD-10-CM | POA: Diagnosis not present

## 2016-03-19 DIAGNOSIS — M109 Gout, unspecified: Secondary | ICD-10-CM | POA: Diagnosis not present

## 2016-03-19 DIAGNOSIS — Z87718 Personal history of other specified (corrected) congenital malformations of genitourinary system: Secondary | ICD-10-CM | POA: Diagnosis not present

## 2016-03-19 DIAGNOSIS — N2581 Secondary hyperparathyroidism of renal origin: Secondary | ICD-10-CM | POA: Diagnosis not present

## 2016-03-19 DIAGNOSIS — N183 Chronic kidney disease, stage 3 (moderate): Secondary | ICD-10-CM | POA: Diagnosis not present

## 2016-03-19 DIAGNOSIS — K5669 Other intestinal obstruction: Secondary | ICD-10-CM | POA: Diagnosis not present

## 2016-03-19 DIAGNOSIS — E782 Mixed hyperlipidemia: Secondary | ICD-10-CM | POA: Diagnosis not present

## 2016-03-19 DIAGNOSIS — I129 Hypertensive chronic kidney disease with stage 1 through stage 4 chronic kidney disease, or unspecified chronic kidney disease: Secondary | ICD-10-CM | POA: Diagnosis not present

## 2016-03-19 DIAGNOSIS — Z940 Kidney transplant status: Secondary | ICD-10-CM | POA: Diagnosis not present

## 2016-04-17 DIAGNOSIS — L820 Inflamed seborrheic keratosis: Secondary | ICD-10-CM | POA: Diagnosis not present

## 2016-04-17 DIAGNOSIS — L538 Other specified erythematous conditions: Secondary | ICD-10-CM | POA: Diagnosis not present

## 2016-04-17 DIAGNOSIS — L918 Other hypertrophic disorders of the skin: Secondary | ICD-10-CM | POA: Diagnosis not present

## 2016-04-17 DIAGNOSIS — L570 Actinic keratosis: Secondary | ICD-10-CM | POA: Diagnosis not present

## 2016-04-17 DIAGNOSIS — Z789 Other specified health status: Secondary | ICD-10-CM | POA: Diagnosis not present

## 2016-04-17 DIAGNOSIS — L821 Other seborrheic keratosis: Secondary | ICD-10-CM | POA: Diagnosis not present

## 2016-04-17 DIAGNOSIS — D485 Neoplasm of uncertain behavior of skin: Secondary | ICD-10-CM | POA: Diagnosis not present

## 2016-04-29 DIAGNOSIS — D899 Disorder involving the immune mechanism, unspecified: Secondary | ICD-10-CM | POA: Diagnosis not present

## 2016-04-29 DIAGNOSIS — R222 Localized swelling, mass and lump, trunk: Secondary | ICD-10-CM | POA: Diagnosis not present

## 2016-04-29 DIAGNOSIS — I10 Essential (primary) hypertension: Secondary | ICD-10-CM | POA: Diagnosis not present

## 2016-04-29 DIAGNOSIS — Z940 Kidney transplant status: Secondary | ICD-10-CM | POA: Diagnosis not present

## 2016-05-05 DIAGNOSIS — R2231 Localized swelling, mass and lump, right upper limb: Secondary | ICD-10-CM | POA: Diagnosis not present

## 2016-05-05 DIAGNOSIS — R222 Localized swelling, mass and lump, trunk: Secondary | ICD-10-CM | POA: Diagnosis not present

## 2016-05-05 DIAGNOSIS — R0989 Other specified symptoms and signs involving the circulatory and respiratory systems: Secondary | ICD-10-CM | POA: Diagnosis not present

## 2016-05-08 DIAGNOSIS — R69 Illness, unspecified: Secondary | ICD-10-CM | POA: Diagnosis not present

## 2016-06-11 DIAGNOSIS — Z79899 Other long term (current) drug therapy: Secondary | ICD-10-CM | POA: Diagnosis not present

## 2016-06-11 DIAGNOSIS — R319 Hematuria, unspecified: Secondary | ICD-10-CM | POA: Diagnosis not present

## 2016-06-11 DIAGNOSIS — Z940 Kidney transplant status: Secondary | ICD-10-CM | POA: Diagnosis not present

## 2016-06-17 DIAGNOSIS — M109 Gout, unspecified: Secondary | ICD-10-CM | POA: Diagnosis not present

## 2016-06-17 DIAGNOSIS — N183 Chronic kidney disease, stage 3 (moderate): Secondary | ICD-10-CM | POA: Diagnosis not present

## 2016-06-17 DIAGNOSIS — N2581 Secondary hyperparathyroidism of renal origin: Secondary | ICD-10-CM | POA: Diagnosis not present

## 2016-06-17 DIAGNOSIS — E782 Mixed hyperlipidemia: Secondary | ICD-10-CM | POA: Diagnosis not present

## 2016-06-17 DIAGNOSIS — Z940 Kidney transplant status: Secondary | ICD-10-CM | POA: Diagnosis not present

## 2016-06-17 DIAGNOSIS — Z23 Encounter for immunization: Secondary | ICD-10-CM | POA: Diagnosis not present

## 2016-06-17 DIAGNOSIS — I770 Arteriovenous fistula, acquired: Secondary | ICD-10-CM | POA: Diagnosis not present

## 2016-06-17 DIAGNOSIS — Z87718 Personal history of other specified (corrected) congenital malformations of genitourinary system: Secondary | ICD-10-CM | POA: Diagnosis not present

## 2016-06-17 DIAGNOSIS — I129 Hypertensive chronic kidney disease with stage 1 through stage 4 chronic kidney disease, or unspecified chronic kidney disease: Secondary | ICD-10-CM | POA: Diagnosis not present

## 2016-06-25 DIAGNOSIS — H02423 Myogenic ptosis of bilateral eyelids: Secondary | ICD-10-CM | POA: Diagnosis not present

## 2016-06-25 DIAGNOSIS — H5340 Unspecified visual field defects: Secondary | ICD-10-CM | POA: Diagnosis not present

## 2016-07-18 ENCOUNTER — Encounter (HOSPITAL_COMMUNITY): Payer: Self-pay | Admitting: Emergency Medicine

## 2016-07-18 ENCOUNTER — Emergency Department (HOSPITAL_COMMUNITY): Payer: Medicare HMO

## 2016-07-18 ENCOUNTER — Emergency Department (HOSPITAL_COMMUNITY)
Admission: EM | Admit: 2016-07-18 | Discharge: 2016-07-18 | Disposition: A | Discharge status: Home / Self Care | Payer: Medicare HMO | Attending: Emergency Medicine | Admitting: Emergency Medicine

## 2016-07-18 DIAGNOSIS — W11XXXA Fall on and from ladder, initial encounter: Secondary | ICD-10-CM | POA: Diagnosis not present

## 2016-07-18 DIAGNOSIS — S0990XA Unspecified injury of head, initial encounter: Secondary | ICD-10-CM | POA: Diagnosis not present

## 2016-07-18 DIAGNOSIS — S300XXA Contusion of lower back and pelvis, initial encounter: Secondary | ICD-10-CM | POA: Insufficient documentation

## 2016-07-18 DIAGNOSIS — S199XXA Unspecified injury of neck, initial encounter: Secondary | ICD-10-CM | POA: Diagnosis not present

## 2016-07-18 DIAGNOSIS — Z7982 Long term (current) use of aspirin: Secondary | ICD-10-CM | POA: Insufficient documentation

## 2016-07-18 DIAGNOSIS — Y929 Unspecified place or not applicable: Secondary | ICD-10-CM | POA: Diagnosis not present

## 2016-07-18 DIAGNOSIS — S5001XA Contusion of right elbow, initial encounter: Secondary | ICD-10-CM | POA: Diagnosis not present

## 2016-07-18 DIAGNOSIS — S0003XA Contusion of scalp, initial encounter: Secondary | ICD-10-CM | POA: Diagnosis not present

## 2016-07-18 DIAGNOSIS — M533 Sacrococcygeal disorders, not elsewhere classified: Secondary | ICD-10-CM | POA: Diagnosis not present

## 2016-07-18 DIAGNOSIS — Y999 Unspecified external cause status: Secondary | ICD-10-CM | POA: Insufficient documentation

## 2016-07-18 DIAGNOSIS — I10 Essential (primary) hypertension: Secondary | ICD-10-CM | POA: Diagnosis not present

## 2016-07-18 DIAGNOSIS — Z79899 Other long term (current) drug therapy: Secondary | ICD-10-CM | POA: Diagnosis not present

## 2016-07-18 DIAGNOSIS — Y9389 Activity, other specified: Secondary | ICD-10-CM | POA: Insufficient documentation

## 2016-07-18 DIAGNOSIS — W19XXXA Unspecified fall, initial encounter: Secondary | ICD-10-CM

## 2016-07-18 DIAGNOSIS — M25521 Pain in right elbow: Secondary | ICD-10-CM | POA: Diagnosis not present

## 2016-07-18 MED ORDER — HYDROCODONE-ACETAMINOPHEN 5-325 MG PO TABS
2.0000 | ORAL_TABLET | Freq: Once | ORAL | Status: AC
  Administered 2016-07-18: 2 via ORAL
  Filled 2016-07-18: qty 2

## 2016-07-18 MED ORDER — HYDROCODONE-ACETAMINOPHEN 7.5-325 MG PO TABS: 1.0000 | ORAL_TABLET | ORAL | 0 refills | Status: DC | PRN

## 2016-07-18 MED ORDER — ONDANSETRON HCL 4 MG PO TABS
4.0000 mg | ORAL_TABLET | Freq: Once | ORAL | Status: AC
  Administered 2016-07-18: 4 mg via ORAL
  Filled 2016-07-18: qty 1

## 2016-07-18 NOTE — Discharge Instructions (Signed)
Your x-ray of the coccyx and elbow are negative for fracture or dislocation. The CT scan of your head and neck are negative for acute problem. Please rest is much as possible. Use Tylenol for mild pain, use Norco for more severe pain.This medication may cause drowsiness. Please do not drink, drive, or participate in activity that requires concentration while taking this medication. Please see Dr.Luking for additional evaluation and for management of your discomfort.

## 2016-07-18 NOTE — ED Provider Notes (Signed)
[facility] DEPT Provider Note   CSN: [id number] Arrival date & time: 07/18/16  1518     History   Chief Complaint Chief Complaint  Patient presents with  . Fall    HPI Scott Macias is a 60 y.o. male.  Patient is a 60 year old male presents to the emergency department following a fall.  The patient states that he was getting some items out of the attic. His foot slipped on one of the rungs of the ladder. He fell, injured the back of his head, his right elbow, and his tailbone. The patient denies loss of consciousness. He asked the neighbor to come over and help him to get up. He noticed a great deal of soreness and headache. He was concerned because his head hit a cement floor. Patient states he has had multiple surgeries including kidney transplant surgery and he wanted to be evaluated following the fall.   The history is provided by the patient.  Fall  Pertinent negatives include no chest pain, no abdominal pain and no shortness of breath.    Past Medical History:  Diagnosis Date  . GERD (gastroesophageal reflux disease)   . Gout   . Hyperlipidemia   . Hypertension   . Hypomagnesemia   . Keratosis    on the face  . Low bone density for age   . Polycystic kidney disease   . Umbilical hernia     Patient Active Problem List   Diagnosis Date Noted  . Kidney transplant recipient 02/06/2015  . Mitral valve regurgitation 02/06/2015  . PALPITATIONS, RECURRENT 10/19/2009  . DYSPNEA 10/19/2009  . HYPERLIPIDEMIA 10/18/2009  . GOUT, UNSPECIFIED 10/18/2009  . Essential hypertension 10/18/2009  . GERD 10/18/2009    Past Surgical History:  Procedure Laterality Date  . AV FISTULA PLACEMENT    . BOWEL RESECTION    . KIDNEY TRANSPLANT    . NEPHRECTOMY TRANSPLANTED ORGAN         Home Medications    Prior to Admission medications   Medication Sig Start Date End Date Taking? Authorizing Provider  alendronate (FOSAMAX) 70 MG tablet Take 70 mg by mouth every 7  (seven) days. Take with a full glass of water on an empty stomach.    Historical Provider, MD  allopurinol (ZYLOPRIM) 300 MG tablet Take 300 mg by mouth daily.    Historical Provider, MD  aspirin 81 MG tablet Take 81 mg by mouth daily.    Historical Provider, MD  atorvastatin (LIPITOR) 20 MG tablet Take 20 mg by mouth daily.    Historical Provider, MD  calcitRIOL (ROCALTROL) 0.5 MCG capsule Take 0.5 mcg by mouth daily.    Historical Provider, MD  doxycycline (VIBRA-TABS) 100 MG tablet Take 1 tablet (100 mg total) by mouth 2 (two) times daily. 02/22/15   Mikey Kirschner, MD  furosemide (LASIX) 20 MG tablet Take 20 mg by mouth 2 (two) times daily.    Historical Provider, MD  gabapentin (NEURONTIN) 100 MG capsule Take 100 mg by mouth daily.  08/01/14   Historical Provider, MD  HYDROcodone-acetaminophen (NORCO) 10-325 MG per tablet Take 1 tablet by mouth every 6 (six) hours as needed.    Historical Provider, MD  magnesium oxide (MAG-OX) 400 MG tablet Take by mouth.    Historical Provider, MD  metoprolol tartrate (LOPRESSOR) 25 MG tablet Take 25 mg by mouth daily.    Historical Provider, MD  mycophenolate (CELLCEPT) 250 MG capsule Take by mouth 2 (two) times daily.    Historical  Provider, MD  predniSONE (DELTASONE) 5 MG tablet Take 5 mg by mouth daily.    Historical Provider, MD  tacrolimus (PROGRAF) 0.5 MG capsule Take 0.5 mg by mouth at bedtime.    Historical Provider, MD    Family History History reviewed. No pertinent family history.  Social History Social History  Substance Use Topics  . Smoking status: Never Smoker  . Smokeless tobacco: Not on file  . Alcohol use No     Allergies   Amoxicillin-pot clavulanate   Review of Systems Review of Systems  Constitutional: Negative for activity change.       All ROS Neg except as noted in HPI  HENT: Negative for nosebleeds.   Eyes: Negative for photophobia and discharge.  Respiratory: Negative for cough, shortness of breath and wheezing.    Cardiovascular: Negative for chest pain and palpitations.  Gastrointestinal: Negative for abdominal pain and blood in stool.  Genitourinary: Negative for dysuria, frequency and hematuria.  Musculoskeletal: Positive for arthralgias. Negative for back pain and neck pain.  Skin: Negative.   Neurological: Negative for dizziness, seizures and speech difficulty.  Psychiatric/Behavioral: Negative for confusion and hallucinations.     Physical Exam Updated Vital Signs BP 134/76 (BP Location: Left Arm)   Pulse 64   Temp 97.8 F (36.6 C) (Oral)   Resp 18   Ht 6' (1.829 m)   Wt 94.3 kg   SpO2 100%   BMI 28.21 kg/m   Physical Exam  HENT:  There is an abrasion of the posterior scalp. There is a hematoma to the posterior scalp is well.  Pulmonary/Chest:  There is symmetrical rise and fall of the chest. The patient speaks in complete sentences without problem.  Abdominal:  Abdomen is soft with good bowel sounds. There is no bruising noted over the transplant area.  Musculoskeletal:  There is soreness and bruising of the right elbow. There is good range of motion of the right elbow. There is full range of motion of the right shoulder, wrists, and fingers.  There is pain in the coccyx sacral area. No palpable deformity. No palpable step off of the lumbar spine.     ED Treatments / Results  Labs (all labs ordered are listed, but only abnormal results are displayed) Labs Reviewed - No data to display  EKG  EKG Interpretation None       Radiology No results found.  Procedures Procedures (including critical care time)  Medications Ordered in ED Medications - No data to display   Initial Impression / Assessment and Plan / ED Course  I have reviewed the triage vital signs and the nursing notes.  Pertinent labs & imaging results that were available during my care of the patient were reviewed by me and considered in my medical decision making (see chart for  details).  Clinical Course     *I have reviewed nursing notes, vital signs, and all appropriate lab and imaging results for this patient.**  Final Clinical Impressions(s) / ED Diagnoses  Vital signs within normal limits. Pulse oximetry is 100% on room air. Within normal limits by my interpretation. CT scan of the head and neck revealed atrophy consistent with age, but no acute findings. X-ray of the right elbow is negative for fracture or dislocation. X-ray of the sacrum-coccyx is negative for fracture or dislocation.  I discussed all the findings of the examination as well as the x-rays and CTs with the patient and his wife in terms which they understand. We discussed that  the patient will be sore over the next day or so. The patient will use Tylenol for mild pain, he will use Norco for more severe pain. The patient is to follow-up with his primary physician for additional evaluation and for additional pain management if needed.    Final diagnoses:  Contusion of scalp, initial encounter  Contusion of coccyx, initial encounter    New Prescriptions New Prescriptions   No medications on file     Lily Kocher, PA-C 07/18/16 1948    Virgel Manifold, MD 07/23/16 1014

## 2016-07-18 NOTE — ED Notes (Signed)
Foot got caught causing pt to fall, abrasion to back of head, denies LOC, lower back, tail bone pain, right shoulder pain

## 2016-07-18 NOTE — ED Triage Notes (Signed)
Pt was putting chairs into attic and pt foot slipped and fell back and hit tailbone, left elbow and posterior head. Pt states he was only 6 inches off the ground when falling.  Pt denies LOC.  PT alert and oriented.

## 2016-08-05 DIAGNOSIS — R69 Illness, unspecified: Secondary | ICD-10-CM | POA: Diagnosis not present

## 2016-08-20 DIAGNOSIS — D2312 Other benign neoplasm of skin of left eyelid, including canthus: Secondary | ICD-10-CM | POA: Diagnosis not present

## 2016-08-20 DIAGNOSIS — H02831 Dermatochalasis of right upper eyelid: Secondary | ICD-10-CM | POA: Diagnosis not present

## 2016-08-20 DIAGNOSIS — H02834 Dermatochalasis of left upper eyelid: Secondary | ICD-10-CM | POA: Diagnosis not present

## 2016-08-22 ENCOUNTER — Encounter: Payer: Medicare Other | Admitting: Family Medicine

## 2016-08-23 ENCOUNTER — Encounter: Payer: Self-pay | Admitting: Family Medicine

## 2016-09-02 DIAGNOSIS — N183 Chronic kidney disease, stage 3 (moderate): Secondary | ICD-10-CM | POA: Diagnosis not present

## 2016-09-02 DIAGNOSIS — R319 Hematuria, unspecified: Secondary | ICD-10-CM | POA: Diagnosis not present

## 2016-09-12 DIAGNOSIS — Z940 Kidney transplant status: Secondary | ICD-10-CM | POA: Diagnosis not present

## 2016-09-12 DIAGNOSIS — Z87718 Personal history of other specified (corrected) congenital malformations of genitourinary system: Secondary | ICD-10-CM | POA: Diagnosis not present

## 2016-09-12 DIAGNOSIS — Z6828 Body mass index (BMI) 28.0-28.9, adult: Secondary | ICD-10-CM | POA: Diagnosis not present

## 2016-09-12 DIAGNOSIS — N2581 Secondary hyperparathyroidism of renal origin: Secondary | ICD-10-CM | POA: Diagnosis not present

## 2016-09-12 DIAGNOSIS — I129 Hypertensive chronic kidney disease with stage 1 through stage 4 chronic kidney disease, or unspecified chronic kidney disease: Secondary | ICD-10-CM | POA: Diagnosis not present

## 2016-09-12 DIAGNOSIS — I770 Arteriovenous fistula, acquired: Secondary | ICD-10-CM | POA: Diagnosis not present

## 2016-09-12 DIAGNOSIS — E782 Mixed hyperlipidemia: Secondary | ICD-10-CM | POA: Diagnosis not present

## 2016-09-12 DIAGNOSIS — N183 Chronic kidney disease, stage 3 (moderate): Secondary | ICD-10-CM | POA: Diagnosis not present

## 2016-09-12 DIAGNOSIS — M109 Gout, unspecified: Secondary | ICD-10-CM | POA: Diagnosis not present

## 2016-10-28 DIAGNOSIS — D899 Disorder involving the immune mechanism, unspecified: Secondary | ICD-10-CM | POA: Diagnosis not present

## 2016-10-28 DIAGNOSIS — Z940 Kidney transplant status: Secondary | ICD-10-CM | POA: Diagnosis not present

## 2016-10-28 DIAGNOSIS — I10 Essential (primary) hypertension: Secondary | ICD-10-CM | POA: Diagnosis not present

## 2016-11-08 DIAGNOSIS — R69 Illness, unspecified: Secondary | ICD-10-CM | POA: Diagnosis not present

## 2016-11-29 DIAGNOSIS — R319 Hematuria, unspecified: Secondary | ICD-10-CM | POA: Diagnosis not present

## 2016-11-29 DIAGNOSIS — D649 Anemia, unspecified: Secondary | ICD-10-CM | POA: Diagnosis not present

## 2016-11-29 DIAGNOSIS — N2581 Secondary hyperparathyroidism of renal origin: Secondary | ICD-10-CM | POA: Diagnosis not present

## 2016-11-29 DIAGNOSIS — Z940 Kidney transplant status: Secondary | ICD-10-CM | POA: Diagnosis not present

## 2016-11-29 DIAGNOSIS — N183 Chronic kidney disease, stage 3 (moderate): Secondary | ICD-10-CM | POA: Diagnosis not present

## 2016-11-29 DIAGNOSIS — E785 Hyperlipidemia, unspecified: Secondary | ICD-10-CM | POA: Diagnosis not present

## 2016-11-29 DIAGNOSIS — Z79899 Other long term (current) drug therapy: Secondary | ICD-10-CM | POA: Diagnosis not present

## 2016-11-29 DIAGNOSIS — E782 Mixed hyperlipidemia: Secondary | ICD-10-CM | POA: Diagnosis not present

## 2016-12-09 DIAGNOSIS — I129 Hypertensive chronic kidney disease with stage 1 through stage 4 chronic kidney disease, or unspecified chronic kidney disease: Secondary | ICD-10-CM | POA: Diagnosis not present

## 2016-12-09 DIAGNOSIS — N2581 Secondary hyperparathyroidism of renal origin: Secondary | ICD-10-CM | POA: Diagnosis not present

## 2016-12-09 DIAGNOSIS — M109 Gout, unspecified: Secondary | ICD-10-CM | POA: Diagnosis not present

## 2016-12-09 DIAGNOSIS — N183 Chronic kidney disease, stage 3 (moderate): Secondary | ICD-10-CM | POA: Diagnosis not present

## 2016-12-09 DIAGNOSIS — Z87718 Personal history of other specified (corrected) congenital malformations of genitourinary system: Secondary | ICD-10-CM | POA: Diagnosis not present

## 2016-12-09 DIAGNOSIS — M1990 Unspecified osteoarthritis, unspecified site: Secondary | ICD-10-CM | POA: Diagnosis not present

## 2016-12-09 DIAGNOSIS — Z940 Kidney transplant status: Secondary | ICD-10-CM | POA: Diagnosis not present

## 2016-12-09 DIAGNOSIS — Z6828 Body mass index (BMI) 28.0-28.9, adult: Secondary | ICD-10-CM | POA: Diagnosis not present

## 2016-12-09 DIAGNOSIS — I770 Arteriovenous fistula, acquired: Secondary | ICD-10-CM | POA: Diagnosis not present

## 2017-01-01 DIAGNOSIS — R69 Illness, unspecified: Secondary | ICD-10-CM | POA: Diagnosis not present

## 2017-02-12 DIAGNOSIS — R69 Illness, unspecified: Secondary | ICD-10-CM | POA: Diagnosis not present

## 2017-02-27 DIAGNOSIS — R319 Hematuria, unspecified: Secondary | ICD-10-CM | POA: Diagnosis not present

## 2017-02-27 DIAGNOSIS — Z940 Kidney transplant status: Secondary | ICD-10-CM | POA: Diagnosis not present

## 2017-02-27 DIAGNOSIS — Z79899 Other long term (current) drug therapy: Secondary | ICD-10-CM | POA: Diagnosis not present

## 2017-02-27 DIAGNOSIS — D649 Anemia, unspecified: Secondary | ICD-10-CM | POA: Diagnosis not present

## 2017-03-10 DIAGNOSIS — R69 Illness, unspecified: Secondary | ICD-10-CM | POA: Diagnosis not present

## 2017-03-11 DIAGNOSIS — I129 Hypertensive chronic kidney disease with stage 1 through stage 4 chronic kidney disease, or unspecified chronic kidney disease: Secondary | ICD-10-CM | POA: Diagnosis not present

## 2017-03-11 DIAGNOSIS — Z6828 Body mass index (BMI) 28.0-28.9, adult: Secondary | ICD-10-CM | POA: Diagnosis not present

## 2017-03-11 DIAGNOSIS — M1990 Unspecified osteoarthritis, unspecified site: Secondary | ICD-10-CM | POA: Diagnosis not present

## 2017-03-11 DIAGNOSIS — E782 Mixed hyperlipidemia: Secondary | ICD-10-CM | POA: Diagnosis not present

## 2017-03-11 DIAGNOSIS — N183 Chronic kidney disease, stage 3 (moderate): Secondary | ICD-10-CM | POA: Diagnosis not present

## 2017-03-11 DIAGNOSIS — I770 Arteriovenous fistula, acquired: Secondary | ICD-10-CM | POA: Diagnosis not present

## 2017-03-11 DIAGNOSIS — M109 Gout, unspecified: Secondary | ICD-10-CM | POA: Diagnosis not present

## 2017-03-11 DIAGNOSIS — N2581 Secondary hyperparathyroidism of renal origin: Secondary | ICD-10-CM | POA: Diagnosis not present

## 2017-03-11 DIAGNOSIS — Z940 Kidney transplant status: Secondary | ICD-10-CM | POA: Diagnosis not present

## 2017-05-04 DIAGNOSIS — R9341 Abnormal radiologic findings on diagnostic imaging of renal pelvis, ureter, or bladder: Secondary | ICD-10-CM | POA: Diagnosis not present

## 2017-05-04 DIAGNOSIS — R9431 Abnormal electrocardiogram [ECG] [EKG]: Secondary | ICD-10-CM | POA: Diagnosis not present

## 2017-05-04 DIAGNOSIS — R1084 Generalized abdominal pain: Secondary | ICD-10-CM | POA: Diagnosis not present

## 2017-05-04 DIAGNOSIS — D899 Disorder involving the immune mechanism, unspecified: Secondary | ICD-10-CM | POA: Diagnosis not present

## 2017-05-04 DIAGNOSIS — R109 Unspecified abdominal pain: Secondary | ICD-10-CM | POA: Diagnosis not present

## 2017-05-04 DIAGNOSIS — M792 Neuralgia and neuritis, unspecified: Secondary | ICD-10-CM | POA: Diagnosis not present

## 2017-05-04 DIAGNOSIS — G8929 Other chronic pain: Secondary | ICD-10-CM | POA: Diagnosis not present

## 2017-05-04 DIAGNOSIS — E785 Hyperlipidemia, unspecified: Secondary | ICD-10-CM | POA: Diagnosis not present

## 2017-05-04 DIAGNOSIS — Z940 Kidney transplant status: Secondary | ICD-10-CM | POA: Diagnosis not present

## 2017-05-04 DIAGNOSIS — I10 Essential (primary) hypertension: Secondary | ICD-10-CM | POA: Diagnosis not present

## 2017-05-04 DIAGNOSIS — E875 Hyperkalemia: Secondary | ICD-10-CM | POA: Diagnosis not present

## 2017-05-04 DIAGNOSIS — R10813 Right lower quadrant abdominal tenderness: Secondary | ICD-10-CM | POA: Diagnosis not present

## 2017-05-04 DIAGNOSIS — R1031 Right lower quadrant pain: Secondary | ICD-10-CM | POA: Diagnosis not present

## 2017-05-04 DIAGNOSIS — R011 Cardiac murmur, unspecified: Secondary | ICD-10-CM | POA: Diagnosis not present

## 2017-05-04 DIAGNOSIS — R600 Localized edema: Secondary | ICD-10-CM | POA: Diagnosis not present

## 2017-05-04 DIAGNOSIS — T82848A Pain from vascular prosthetic devices, implants and grafts, initial encounter: Secondary | ICD-10-CM | POA: Diagnosis not present

## 2017-05-04 DIAGNOSIS — M810 Age-related osteoporosis without current pathological fracture: Secondary | ICD-10-CM | POA: Diagnosis not present

## 2017-05-04 DIAGNOSIS — K219 Gastro-esophageal reflux disease without esophagitis: Secondary | ICD-10-CM | POA: Diagnosis not present

## 2017-05-04 DIAGNOSIS — R140 Abdominal distension (gaseous): Secondary | ICD-10-CM | POA: Diagnosis not present

## 2017-05-05 DIAGNOSIS — T82848A Pain from vascular prosthetic devices, implants and grafts, initial encounter: Secondary | ICD-10-CM | POA: Diagnosis not present

## 2017-05-05 DIAGNOSIS — D899 Disorder involving the immune mechanism, unspecified: Secondary | ICD-10-CM | POA: Diagnosis not present

## 2017-05-05 DIAGNOSIS — R1084 Generalized abdominal pain: Secondary | ICD-10-CM | POA: Diagnosis not present

## 2017-05-05 DIAGNOSIS — M810 Age-related osteoporosis without current pathological fracture: Secondary | ICD-10-CM | POA: Diagnosis not present

## 2017-05-05 DIAGNOSIS — E785 Hyperlipidemia, unspecified: Secondary | ICD-10-CM | POA: Diagnosis not present

## 2017-05-05 DIAGNOSIS — I10 Essential (primary) hypertension: Secondary | ICD-10-CM | POA: Diagnosis not present

## 2017-05-05 DIAGNOSIS — Z940 Kidney transplant status: Secondary | ICD-10-CM | POA: Diagnosis not present

## 2017-05-12 ENCOUNTER — Ambulatory Visit (INDEPENDENT_AMBULATORY_CARE_PROVIDER_SITE_OTHER): Payer: Medicare HMO | Admitting: Family Medicine

## 2017-05-12 ENCOUNTER — Encounter: Payer: Self-pay | Admitting: Family Medicine

## 2017-05-12 VITALS — BP 114/70 | Temp 97.6°F | Ht 72.0 in | Wt 201.0 lb

## 2017-05-12 DIAGNOSIS — Z23 Encounter for immunization: Secondary | ICD-10-CM

## 2017-05-12 DIAGNOSIS — R1013 Epigastric pain: Secondary | ICD-10-CM | POA: Diagnosis not present

## 2017-05-12 NOTE — Progress Notes (Signed)
   Subjective:    Patient ID: Scott Macias, male    DOB: 1956-05-07, 61 y.o.   MRN: [id number]  HPIFollow up hospitalization. Discharged sept 17th. Pt states no abdominal pain now. No concerns.  Patient presents for follow-up after hospitalization. Patient was admitted to do with acute concerns. This is reviewed through Epic.  Patient has history of a graft transplant kidney. Patient presented with pain in the region this kidney. Ultrasound scan revealed questionable perirenal inflammation.  Base in this patient was meant to the hospital. Started on IV antibiotics. Urine culture was done. This returned negative.  The day following hospitalization the patient was feeling better.  Now he reports he is pretty much back to baseline  Flu vaccine today.     Review of Systems No headache, no major weight loss or weight gain, no chest pain no back pain abdominal pain no change in bowel habits complete ROS otherwise negative     Objective:   Physical Exam Alert and oriented, vitals reviewed and stable, NAD ENT-TM's and ext canals WNL bilat via otoscopic exam Soft palate, tonsils and post pharynx WNL via oropharyngeal exam Neck-symmetric, no masses; thyroid nonpalpable and nontender Pulmonary-no tachypnea or accessory muscle use; Clear without wheezes via auscultation Card--no abnrml murmurs, rhythm reg and rate WNL Carotid pulses symmetric, without bruits Abdomen surgical scar noted. Slight epigastric tenderness.       Assessment & Plan:  Impression status post overnight admission for questionable pyelonephritis. Of note urine grew out nothing. Blood work showed normal white blood count. Patient feels fine and is back to baseline. Encouraged to follow-up with nephrologist in [place] transplant specialist. Flu shot today

## 2017-05-27 DIAGNOSIS — R5383 Other fatigue: Secondary | ICD-10-CM | POA: Diagnosis not present

## 2017-05-27 DIAGNOSIS — R319 Hematuria, unspecified: Secondary | ICD-10-CM | POA: Diagnosis not present

## 2017-05-27 DIAGNOSIS — N2581 Secondary hyperparathyroidism of renal origin: Secondary | ICD-10-CM | POA: Diagnosis not present

## 2017-05-27 DIAGNOSIS — D649 Anemia, unspecified: Secondary | ICD-10-CM | POA: Diagnosis not present

## 2017-05-27 DIAGNOSIS — N183 Chronic kidney disease, stage 3 (moderate): Secondary | ICD-10-CM | POA: Diagnosis not present

## 2017-05-27 DIAGNOSIS — Z79899 Other long term (current) drug therapy: Secondary | ICD-10-CM | POA: Diagnosis not present

## 2017-05-27 DIAGNOSIS — E782 Mixed hyperlipidemia: Secondary | ICD-10-CM | POA: Diagnosis not present

## 2017-05-27 DIAGNOSIS — E559 Vitamin D deficiency, unspecified: Secondary | ICD-10-CM | POA: Diagnosis not present

## 2017-05-27 DIAGNOSIS — E785 Hyperlipidemia, unspecified: Secondary | ICD-10-CM | POA: Diagnosis not present

## 2017-06-03 DIAGNOSIS — Z940 Kidney transplant status: Secondary | ICD-10-CM | POA: Diagnosis not present

## 2017-06-03 DIAGNOSIS — I129 Hypertensive chronic kidney disease with stage 1 through stage 4 chronic kidney disease, or unspecified chronic kidney disease: Secondary | ICD-10-CM | POA: Diagnosis not present

## 2017-06-03 DIAGNOSIS — Z87718 Personal history of other specified (corrected) congenital malformations of genitourinary system: Secondary | ICD-10-CM | POA: Diagnosis not present

## 2017-06-03 DIAGNOSIS — I770 Arteriovenous fistula, acquired: Secondary | ICD-10-CM | POA: Diagnosis not present

## 2017-06-03 DIAGNOSIS — N183 Chronic kidney disease, stage 3 (moderate): Secondary | ICD-10-CM | POA: Diagnosis not present

## 2017-06-03 DIAGNOSIS — E782 Mixed hyperlipidemia: Secondary | ICD-10-CM | POA: Diagnosis not present

## 2017-06-03 DIAGNOSIS — M109 Gout, unspecified: Secondary | ICD-10-CM | POA: Diagnosis not present

## 2017-06-03 DIAGNOSIS — N2581 Secondary hyperparathyroidism of renal origin: Secondary | ICD-10-CM | POA: Diagnosis not present

## 2017-06-03 DIAGNOSIS — M1990 Unspecified osteoarthritis, unspecified site: Secondary | ICD-10-CM | POA: Diagnosis not present

## 2017-06-04 DIAGNOSIS — R69 Illness, unspecified: Secondary | ICD-10-CM | POA: Diagnosis not present

## 2017-06-05 DIAGNOSIS — R69 Illness, unspecified: Secondary | ICD-10-CM | POA: Diagnosis not present

## 2017-06-06 DIAGNOSIS — R69 Illness, unspecified: Secondary | ICD-10-CM | POA: Diagnosis not present

## 2017-06-10 ENCOUNTER — Encounter: Payer: Self-pay | Admitting: Family Medicine

## 2017-06-10 ENCOUNTER — Ambulatory Visit (INDEPENDENT_AMBULATORY_CARE_PROVIDER_SITE_OTHER): Payer: Medicare HMO | Admitting: Family Medicine

## 2017-06-10 VITALS — BP 118/74 | Ht 72.0 in | Wt 198.0 lb

## 2017-06-10 DIAGNOSIS — Z1211 Encounter for screening for malignant neoplasm of colon: Secondary | ICD-10-CM

## 2017-06-10 DIAGNOSIS — Z0000 Encounter for general adult medical examination without abnormal findings: Secondary | ICD-10-CM | POA: Diagnosis not present

## 2017-06-10 DIAGNOSIS — Z125 Encounter for screening for malignant neoplasm of prostate: Secondary | ICD-10-CM | POA: Diagnosis not present

## 2017-06-10 DIAGNOSIS — I340 Nonrheumatic mitral (valve) insufficiency: Secondary | ICD-10-CM | POA: Diagnosis not present

## 2017-06-10 NOTE — Progress Notes (Signed)
   Subjective:    Patient ID: Scott Macias, male    DOB: Dec 13, 1955, 61 y.o.   MRN: [id number]  HPI The patient comes in today for a wellness visit.    A review of their health history was completed.  A review of medications was also completed.  Any needed refills; none  Eating habits: health conscious  Falls/  MVA accidents in past few months: none  Regular exercise: not exercise. But active during the day  Specialist pt sees on regular basis: Dr. Jimmy Footman at [facility] Dr. Duane Boston at [facility] issues were discussed.   Additional concerns: none  Now off bp meds, with numbers relatively low  Followed by specialist for his lipids and blood pressure  Stays active but does not exercise regulaly   Diet overall pretty good      Review of Systems  Constitutional: Negative for activity change, appetite change and fever.  HENT: Negative for congestion and rhinorrhea.   Eyes: Negative for discharge.  Respiratory: Negative for cough and wheezing.   Cardiovascular: Negative for chest pain.  Gastrointestinal: Negative for abdominal pain, blood in stool and vomiting.  Genitourinary: Negative for difficulty urinating and frequency.  Musculoskeletal: Negative for neck pain.  Skin: Negative for rash.  Allergic/Immunologic: Negative for environmental allergies and food allergies.  Neurological: Negative for weakness and headaches.  Psychiatric/Behavioral: Negative for agitation.  All other systems reviewed and are negative.      Objective:   Physical Exam  Constitutional: He appears well-developed and well-nourished.  HENT:  Head: Normocephalic and atraumatic.  Right Ear: External ear normal.  Left Ear: External ear normal.  Nose: Nose normal.  Mouth/Throat: Oropharynx is clear and moist.  Eyes: Pupils are equal, round, and reactive to light. EOM are normal.  Neck: Normal range of motion. Neck supple. No thyromegaly present.  Cardiovascular:  Normal rate, regular rhythm and normal heart sounds.   No murmur heard. Positive mitral valve regurgitation murmur  Pulmonary/Chest: Effort normal and breath sounds normal. No respiratory distress. He has no wheezes.  Abdominal: Soft. Bowel sounds are normal. He exhibits no distension and no mass. There is no tenderness.  Genitourinary: Penis normal.  Musculoskeletal: Normal range of motion. He exhibits no edema.  Lymphadenopathy:    He has no cervical adenopathy.  Neurological: He is alert. He exhibits normal muscle tone.  Skin: Skin is warm and dry. No erythema.  Psychiatric: He has a normal mood and affect. His behavior is normal. Judgment normal.  Vitals reviewed.         Assessment & Plan:  mpression 1 wellness exam. Diet exercise discussed Of note patient's Lipitor managed by his specialist #2 transplanted kidney. Follow closely by specialist  #3 screening patient needs a PSA and colonoscopy. Somewhat resistant to getting this but I think he will press on and do it.  #4 mitral regurgitation needs cardiologist.Patient promises [place] he will get back with [place] on cardiologist to refer him to Last echocardiogram

## 2017-06-12 ENCOUNTER — Encounter: Payer: Self-pay | Admitting: Family Medicine

## 2017-06-12 ENCOUNTER — Encounter: Payer: Self-pay | Admitting: Gastroenterology

## 2017-06-12 DIAGNOSIS — D1801 Hemangioma of skin and subcutaneous tissue: Secondary | ICD-10-CM | POA: Diagnosis not present

## 2017-06-12 DIAGNOSIS — L918 Other hypertrophic disorders of the skin: Secondary | ICD-10-CM | POA: Diagnosis not present

## 2017-06-12 DIAGNOSIS — L570 Actinic keratosis: Secondary | ICD-10-CM | POA: Diagnosis not present

## 2017-06-12 DIAGNOSIS — Z1283 Encounter for screening for malignant neoplasm of skin: Secondary | ICD-10-CM | POA: Diagnosis not present

## 2017-06-12 DIAGNOSIS — L0889 Other specified local infections of the skin and subcutaneous tissue: Secondary | ICD-10-CM | POA: Diagnosis not present

## 2017-06-12 DIAGNOSIS — L821 Other seborrheic keratosis: Secondary | ICD-10-CM | POA: Diagnosis not present

## 2017-06-12 DIAGNOSIS — L815 Leukoderma, not elsewhere classified: Secondary | ICD-10-CM | POA: Diagnosis not present

## 2017-06-13 ENCOUNTER — Telehealth: Payer: Self-pay | Admitting: Family Medicine

## 2017-06-13 NOTE — Telephone Encounter (Signed)
Review PSA results in results folder from [place].

## 2017-06-30 ENCOUNTER — Encounter: Payer: Self-pay | Admitting: Family Medicine

## 2017-07-22 ENCOUNTER — Encounter: Payer: Self-pay | Admitting: Family Medicine

## 2017-07-22 DIAGNOSIS — Z7952 Long term (current) use of systemic steroids: Secondary | ICD-10-CM | POA: Diagnosis not present

## 2017-07-22 DIAGNOSIS — E785 Hyperlipidemia, unspecified: Secondary | ICD-10-CM | POA: Diagnosis not present

## 2017-07-22 DIAGNOSIS — Z7982 Long term (current) use of aspirin: Secondary | ICD-10-CM | POA: Diagnosis not present

## 2017-07-22 DIAGNOSIS — Z905 Acquired absence of kidney: Secondary | ICD-10-CM | POA: Diagnosis not present

## 2017-07-22 DIAGNOSIS — Z87718 Personal history of other specified (corrected) congenital malformations of genitourinary system: Secondary | ICD-10-CM | POA: Diagnosis not present

## 2017-07-22 DIAGNOSIS — Z1211 Encounter for screening for malignant neoplasm of colon: Secondary | ICD-10-CM | POA: Diagnosis not present

## 2017-07-22 DIAGNOSIS — I129 Hypertensive chronic kidney disease with stage 1 through stage 4 chronic kidney disease, or unspecified chronic kidney disease: Secondary | ICD-10-CM | POA: Diagnosis not present

## 2017-07-22 DIAGNOSIS — K219 Gastro-esophageal reflux disease without esophagitis: Secondary | ICD-10-CM | POA: Diagnosis not present

## 2017-07-22 DIAGNOSIS — Z79899 Other long term (current) drug therapy: Secondary | ICD-10-CM | POA: Diagnosis not present

## 2017-07-22 DIAGNOSIS — K5730 Diverticulosis of large intestine without perforation or abscess without bleeding: Secondary | ICD-10-CM | POA: Diagnosis not present

## 2017-07-22 DIAGNOSIS — N189 Chronic kidney disease, unspecified: Secondary | ICD-10-CM | POA: Diagnosis not present

## 2017-07-22 DIAGNOSIS — Z940 Kidney transplant status: Secondary | ICD-10-CM | POA: Diagnosis not present

## 2017-07-23 ENCOUNTER — Encounter: Payer: Self-pay | Admitting: Family Medicine

## 2017-07-30 ENCOUNTER — Encounter: Payer: Medicare HMO | Admitting: Gastroenterology

## 2017-10-03 ENCOUNTER — Telehealth: Payer: Self-pay | Admitting: Family Medicine

## 2017-10-03 MED ORDER — VALACYCLOVIR HCL 1 G PO TABS: ORAL_TABLET | ORAL | 0 refills | Status: DC

## 2017-10-03 NOTE — Telephone Encounter (Signed)
Has been outside working in the yard today and just came in and took his coat off and has a quarter size place on his arm that is itching.  Wife says it looks like shingles.  Needs advice.

## 2017-10-03 NOTE — Telephone Encounter (Signed)
One area on arm that itches-has the appearance of shingle- was just noticed by patient.

## 2017-10-03 NOTE — Telephone Encounter (Addendum)
Patient advised per Dr Nicki Reaper: It is hard to know if this is shingles or not but if it is shingles it could rapidly get worse.  With his health history of being a kidney transplant recipient Dr Nicki Reaper would recommend being on the safe side-initiate Valtrex 1 g, take 1 three times daily for 7 days.  May use over-the-counter steroid cream twice daily as needed for itching.  If this area progressively gets worse it would be wise to get it looked at either at a urgent care center or with [place] depending on the situation-if patient is feeling uneasy about all of this he can go to urgent care center. Patient verbalized understanding. Prescription sent electronically to pharmacy.

## 2017-10-03 NOTE — Telephone Encounter (Signed)
It is hard to know if this is shingles or not but if it is shingles it could rapidly get worse.  With his health history of being a kidney transplant recipient I would recommend being on the safe side-initiate Valtrex 1 g, take 1 three times daily for 7 days.  May use over-the-counter steroid cream twice daily as needed for itching.  If this area progressively gets worse it would be wise to get it looked at either at a urgent care center or with [place] depending on the situation-if patient is feeling uneasy about all of this he can go to urgent care center (given that it is 4:50 PM)

## 2017-11-29 IMAGING — CT CT CERVICAL SPINE W/O CM
4 of 7 series · 14 of 33 positions shown, 15 images · non-contrast
Comparison: None.

CLINICAL DATA: 60-year-old male with head and neck injury following
fall. Initial encounter.

EXAM:
CT HEAD WITHOUT CONTRAST
CT CERVICAL SPINE WITHOUT CONTRAST
TECHNIQUE: Multidetector CT imaging of the head and cervical spine was
performed following the standard protocol without intravenous
contrast. Multiplanar CT image reconstructions of the cervical spine
were also generated.

[Series 8: c spine soft · axial · 0.32mm/px · z∈[+1313,+1409]mm · 4 of 81 slices shown]
[im 17/81  soft-tissue]
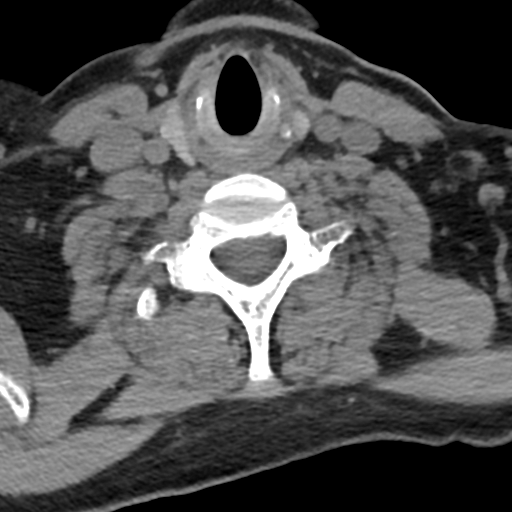
[im 33/81  soft-tissue]
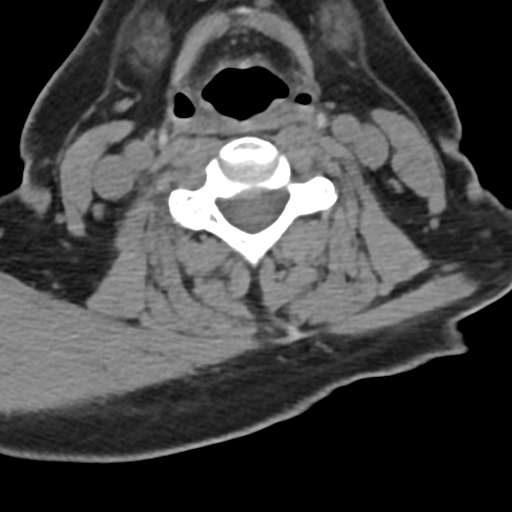
[im 49/81  soft-tissue]
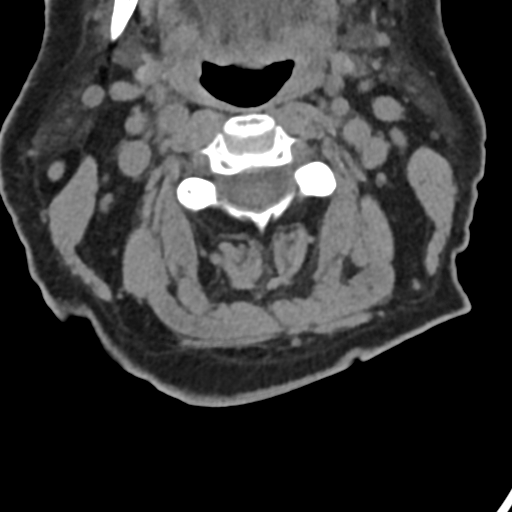
[im 65/81  soft-tissue]
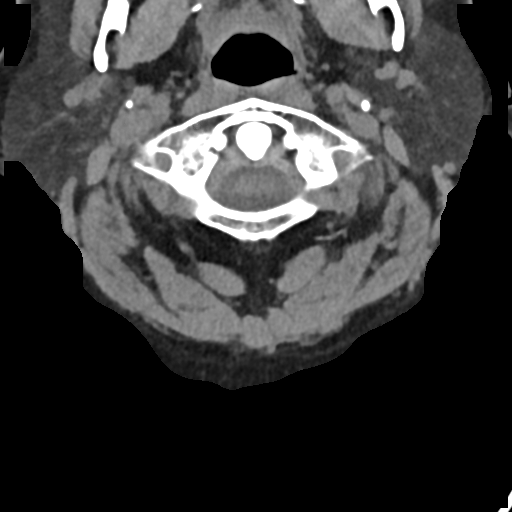

[Series 9: sagittal bone · sagittal · 0.24mm/px · 5 of 61 slices shown]
[im 11/61  bone]
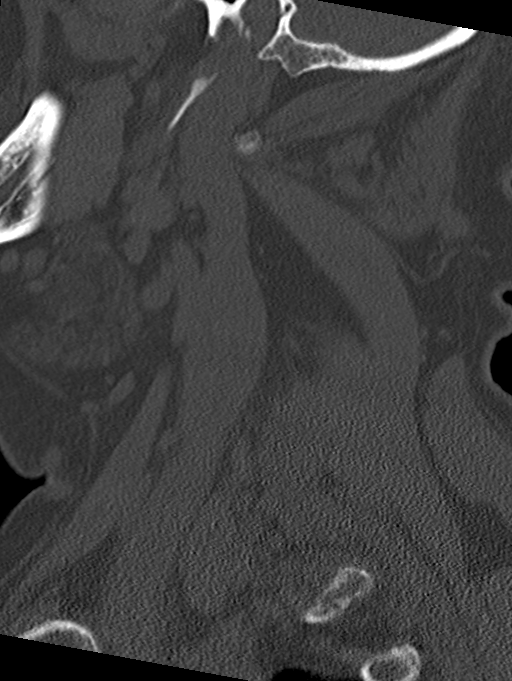
[im 21/61  bone]
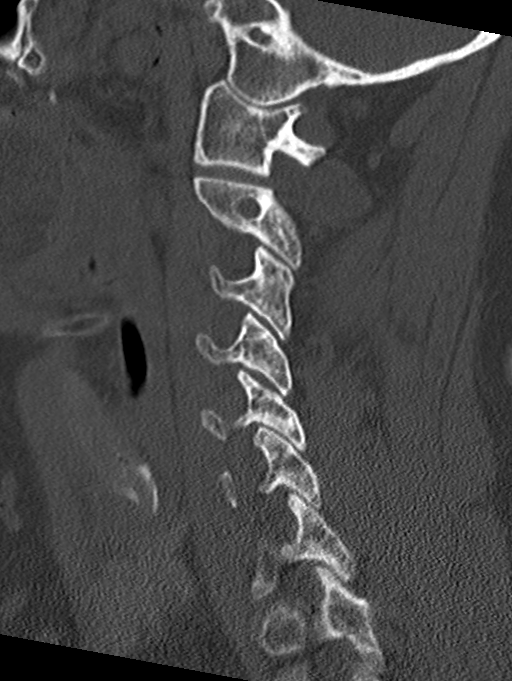
[im 31/61  bone]
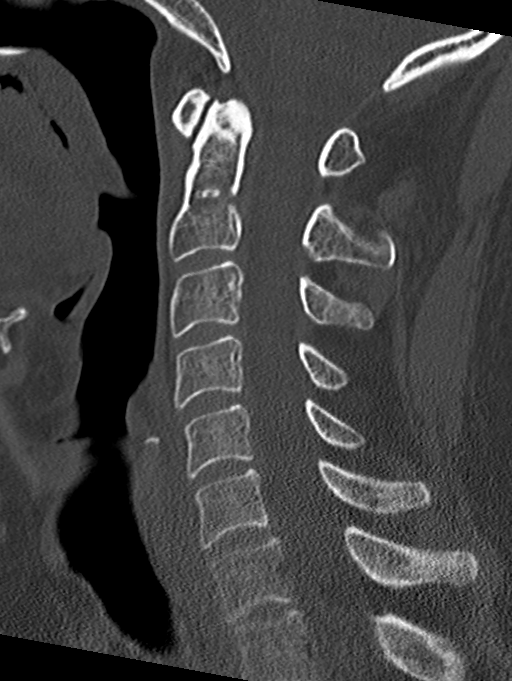
[im 41/61  bone]
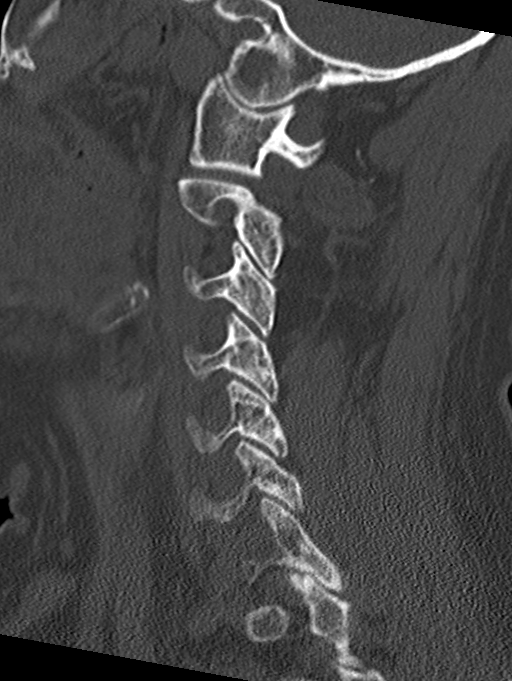
[im 51/61  bone]
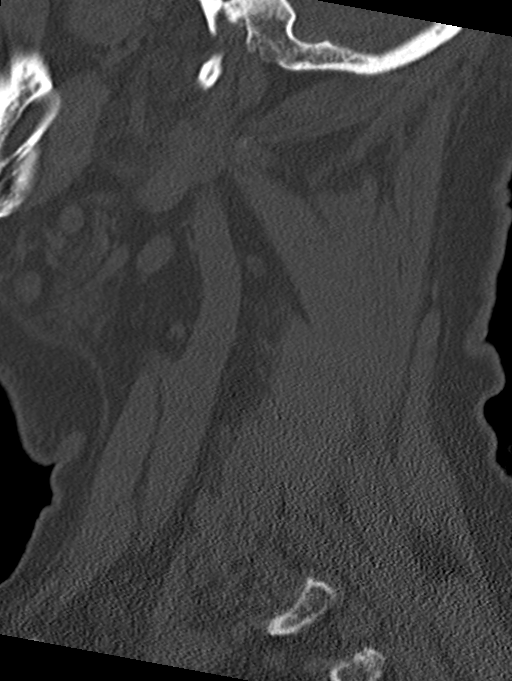

[Series 10: coronal bone · coronal · 0.24mm/px · 1 of 61 slices shown]
[im 31/61  bone]
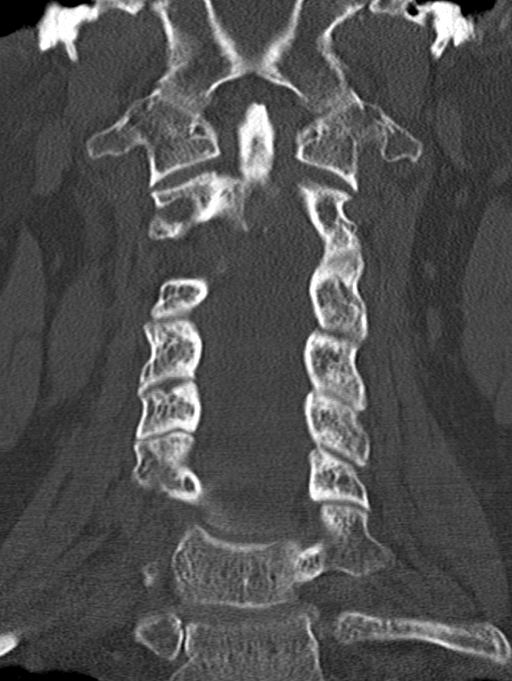

[Series 11: orthogonal bone · axial · 0.21mm/px · z∈[+1289,+1380]mm · 4 of 83 slices shown, 5 images]
[im 17/83  soft-tissue]
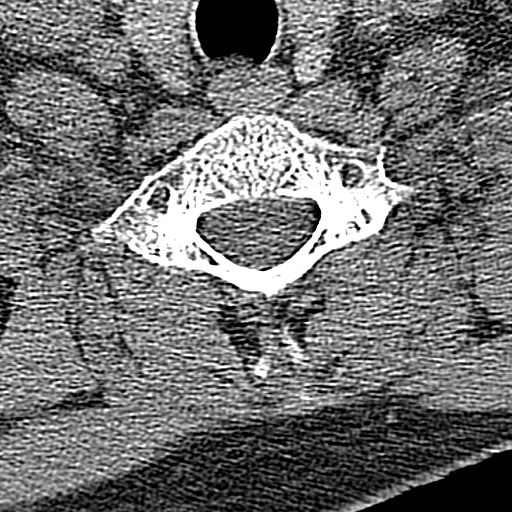
[im 17/83  bone]
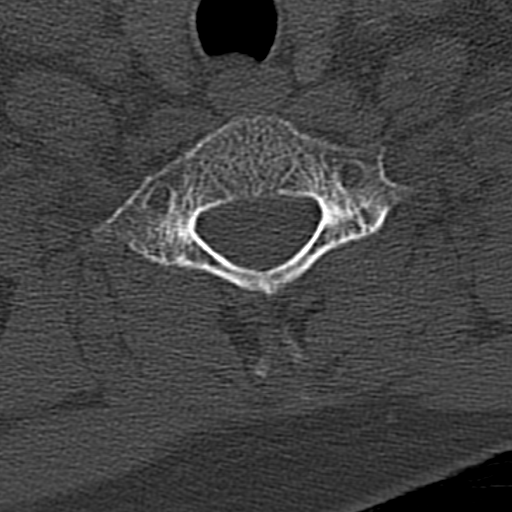
[im 33/83  bone]
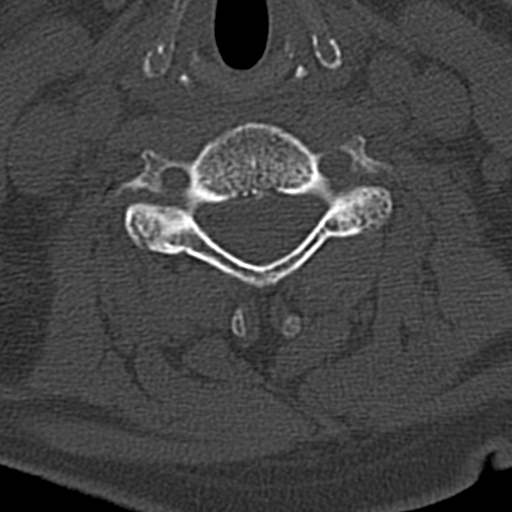
[im 50/83  bone]
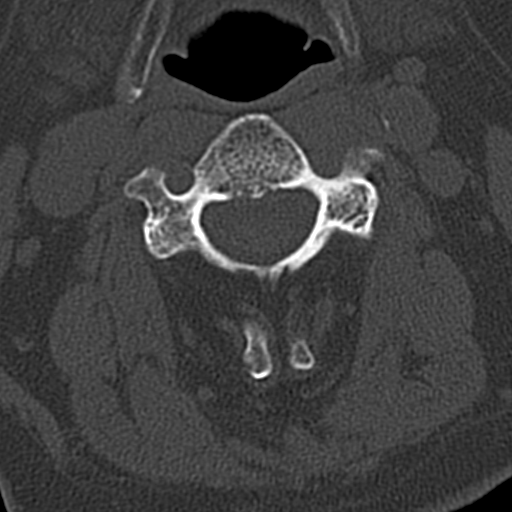
[im 66/83  bone]
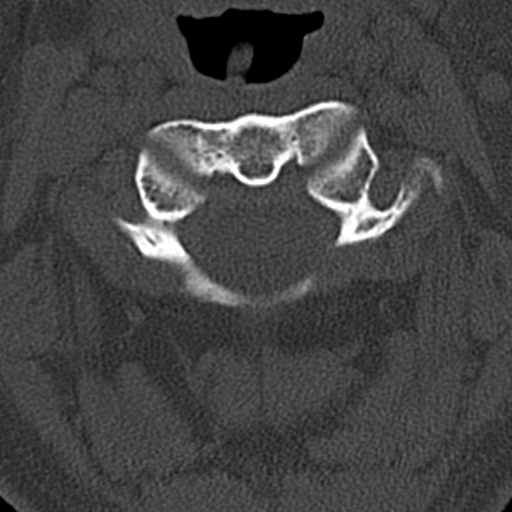

[14 of 33 positions shown; findings below may reference images not displayed]

FINDINGS: CT HEAD FINDINGS

Brain: No evidence of acute infarction, hemorrhage, hydrocephalus,
extra-axial collection or mass lesion/mass effect. Generalized
cerebral and cerebellar volume loss noted.

Vascular: Mild intracranial atherosclerotic vascular calcifications
noted.

Skull: Normal. Negative for fracture or focal lesion.

Sinuses/Orbits: No acute finding.

Other: Posterior scalp soft tissue swelling is noted.

CT CERVICAL SPINE FINDINGS

Alignment: Normal.

Skull base and vertebrae: No acute fracture. No primary bone lesion
or focal pathologic process.

Soft tissues and spinal canal: No prevertebral fluid or swelling. No
visible canal hematoma.

Disc levels:  Unremarkable

Upper chest: Negative.

Other: None
IMPRESSION: Posterior scalp soft tissue swelling without fracture.

No evidence of acute intracranial abnormality.  Generalized atrophy.

Unremarkable CT of the cervical spine.

## 2017-11-29 IMAGING — DX DG SACRUM/COCCYX 2+V
3 series · 3 of 3 positions shown · non-contrast
Comparison: None.

CLINICAL DATA: Fall with posterior head injury. Tailbone injury.
Initial encounter.

EXAM:
SACRUM AND COCCYX - 2+ VIEW

[coccyx ap]
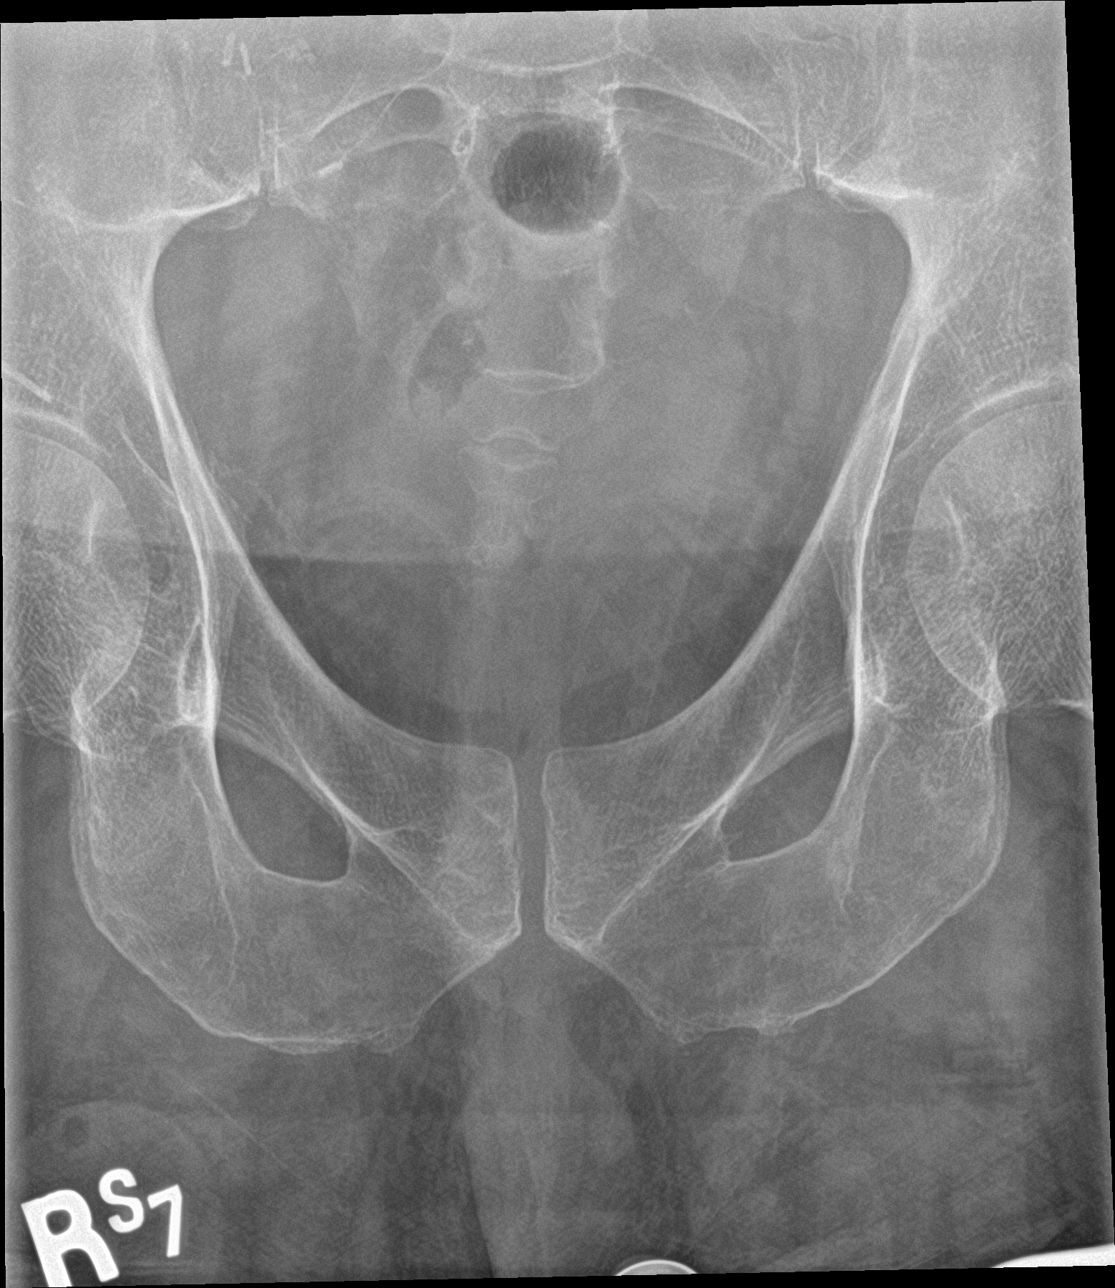

[sacrum ap]
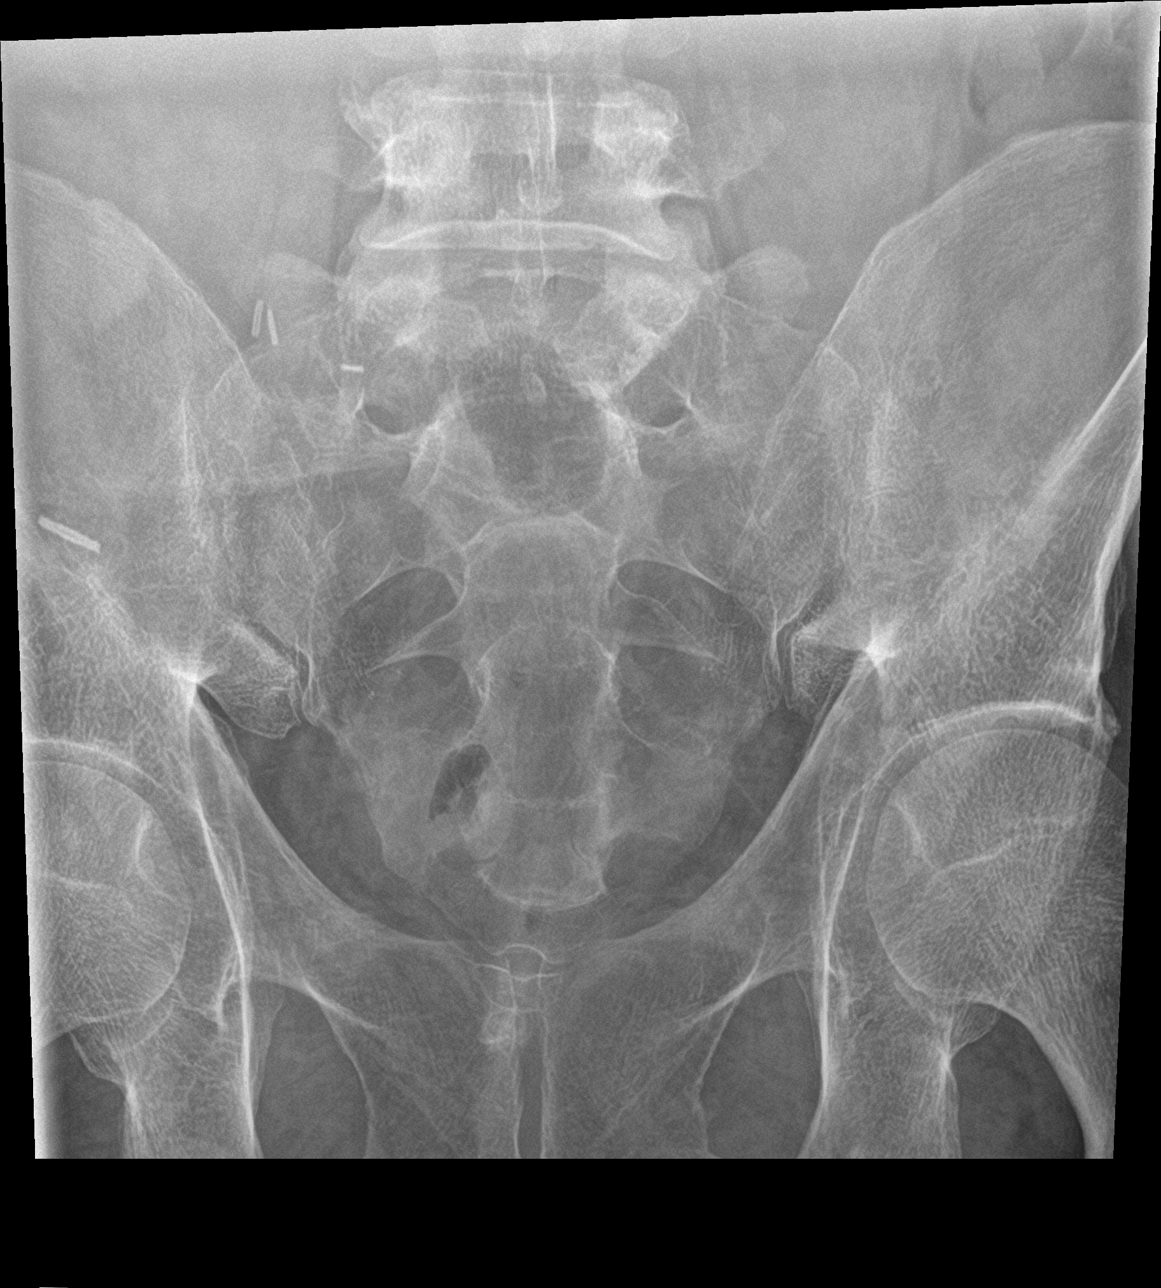

[sacrum lat]
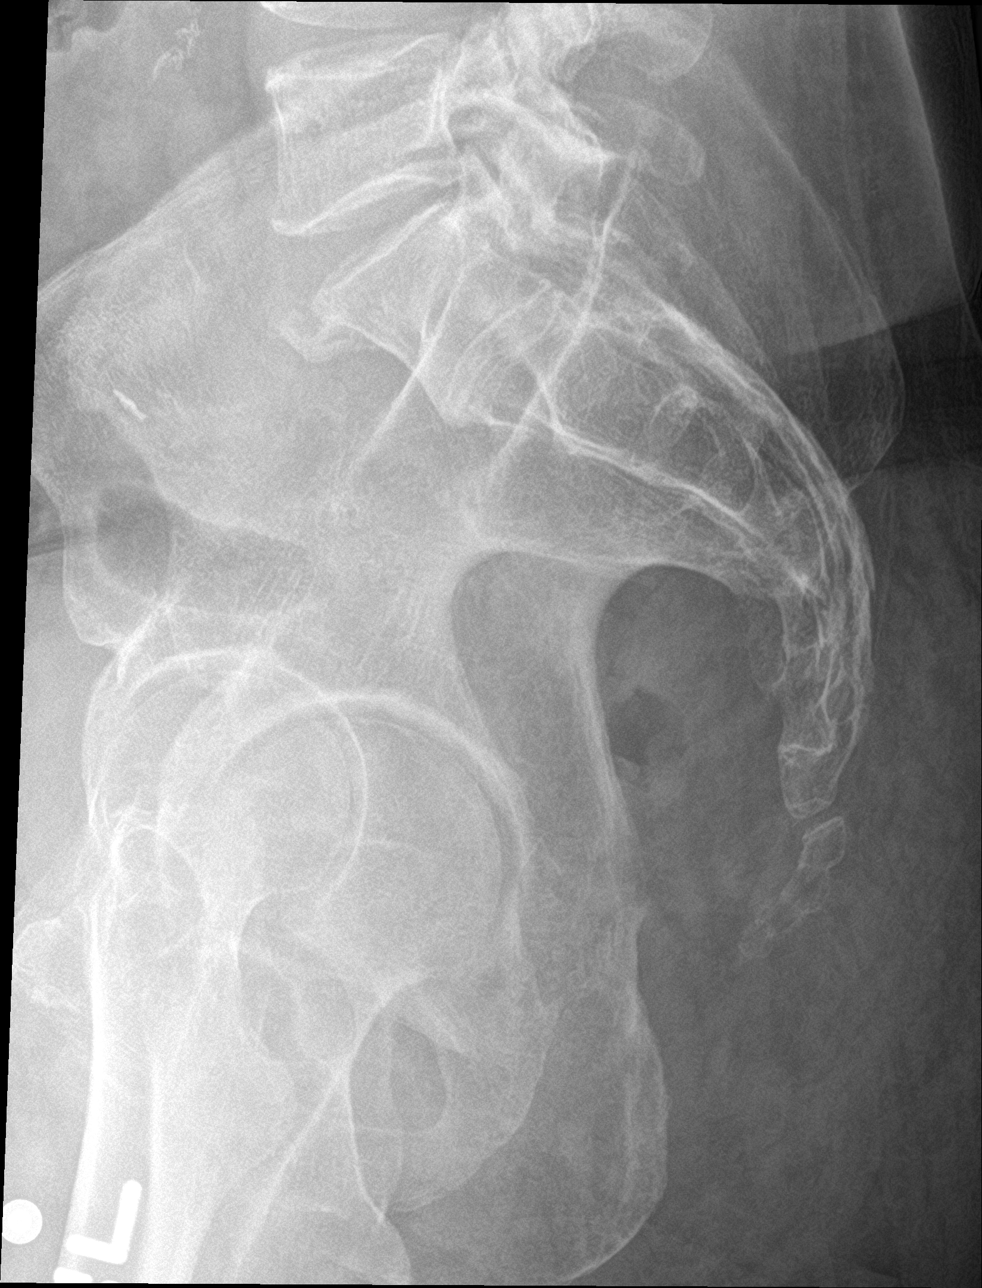

[3 of 3 positions shown; findings below may reference images not displayed]

FINDINGS: There is no evidence of fracture or other focal bone lesions.
IMPRESSION: Negative.

## 2018-09-22 ENCOUNTER — Encounter: Payer: Self-pay | Admitting: *Deleted

## 2018-09-22 ENCOUNTER — Ambulatory Visit: Payer: Medicare HMO | Admitting: Cardiology

## 2018-09-22 VITALS — BP 117/69 | HR 71 | Ht 72.0 in | Wt 208.0 lb

## 2018-09-22 DIAGNOSIS — I10 Essential (primary) hypertension: Secondary | ICD-10-CM

## 2018-09-22 DIAGNOSIS — R011 Cardiac murmur, unspecified: Secondary | ICD-10-CM | POA: Diagnosis not present

## 2018-09-22 DIAGNOSIS — R0789 Other chest pain: Secondary | ICD-10-CM

## 2018-09-22 NOTE — Progress Notes (Signed)
Clinical Summary Mr. Jagiello is a 63 y.o.male seen as new consult, referred by Dr Wolfgang Phoenix for chest pain.  1. Chest pain - only with exertion. Fullness upper chest.No other associated symptoms. Lasts just few a seconds - varies on degree of exertion - episodes about once a week  CAD risk factors: HTN, HL   2. ESRD - history of prior transplant. Followed at [facility].      Past Medical History:  Diagnosis Date  . GERD (gastroesophageal reflux disease)   . Gout   . Hyperlipidemia   . Hypertension   . Hypomagnesemia   . Keratosis    on the face  . Low bone density for age   . Polycystic kidney disease   . Umbilical hernia      Allergies  Allergen Reactions  . Amoxicillin-Pot Clavulanate     REACTION: unspecified     Current Outpatient Medications  Medication Sig Dispense Refill  . allopurinol (ZYLOPRIM) 300 MG tablet Take 300 mg by mouth daily.    Marland Kitchen aspirin 81 MG tablet Take 81 mg by mouth daily.    Marland Kitchen atorvastatin (LIPITOR) 20 MG tablet Take 20 mg by mouth daily.    Marland Kitchen BIOTIN 5000 PO Take by mouth.    . calcitRIOL (ROCALTROL) 0.5 MCG capsule Take 0.5 mcg by mouth daily.    . furosemide (LASIX) 20 MG tablet Take 20 mg by mouth 2 (two) times daily.    Marland Kitchen gabapentin (NEURONTIN) 100 MG capsule Take 100 mg by mouth daily.     Marland Kitchen HYDROcodone-acetaminophen (NORCO) 7.5-325 MG tablet Take 1 tablet by mouth every 4 (four) hours as needed. 12 tablet 0  . magnesium oxide (MAG-OX) 400 MG tablet Take by mouth.    . metoprolol tartrate (LOPRESSOR) 25 MG tablet Take 25 mg by mouth daily.    . mycophenolate (CELLCEPT) 250 MG capsule Take by mouth 2 (two) times daily.    Marland Kitchen omeprazole (PRILOSEC) 20 MG capsule Take 20 mg by mouth daily.    . predniSONE (DELTASONE) 5 MG tablet Take 5 mg by mouth daily.    . tacrolimus (PROGRAF) 0.5 MG capsule Take 0.5 mg by mouth at bedtime.    . valACYclovir (VALTREX) 1000 MG tablet One tablet three times a day for 7 days 21 tablet 0   No current  facility-administered medications for this visit.      Past Surgical History:  Procedure Laterality Date  . AV FISTULA PLACEMENT    . BOWEL RESECTION    . KIDNEY TRANSPLANT    . NEPHRECTOMY TRANSPLANTED ORGAN       Allergies  Allergen Reactions  . Amoxicillin-Pot Clavulanate     REACTION: unspecified      Family History  Problem Relation Age of Onset  . CAD Father        died from complications of bypass surgery  . Kidney disease Sister        renal transplant  . Parkinson's disease Brother      Social History Mr. Clarey reports that he has never smoked. He has never used smokeless tobacco. Mr. Krisher reports no history of alcohol use.   Review of Systems CONSTITUTIONAL: No weight loss, fever, chills, weakness or fatigue.  HEENT: Eyes: No visual loss, blurred vision, double vision or yellow sclerae.No hearing loss, sneezing, congestion, runny nose or sore throat.  SKIN: No rash or itching.  CARDIOVASCULAR: per hpi RESPIRATORY: No shortness of breath, cough or sputum.  GASTROINTESTINAL: No anorexia, nausea,  vomiting or diarrhea. No abdominal pain or blood.  GENITOURINARY: No burning on urination, no polyuria NEUROLOGICAL: No headache, dizziness, syncope, paralysis, ataxia, numbness or tingling in the extremities. No change in bowel or bladder control.  MUSCULOSKELETAL: No muscle, back pain, joint pain or stiffness.  LYMPHATICS: No enlarged nodes. No history of splenectomy.  PSYCHIATRIC: No history of depression or anxiety.  ENDOCRINOLOGIC: No reports of sweating, cold or heat intolerance. No polyuria or polydipsia.  Marland Kitchen   Physical Examination Vitals:   09/22/18 0839  BP: 117/69  Pulse: 71  SpO2: 98%   Vitals:   09/22/18 0839  Weight: 208 lb (94.3 kg)  Height: 6' (1.829 m)    Gen: resting comfortably, no acute distress HEENT: no scleral icterus, pupils equal round and reactive, no palptable cervical adenopathy,  CV: RRR, 3/6 systolic murmur rusb, 3/6  systolic murmur apex Resp: Clear to auscultation bilaterally GI: abdomen is soft, non-tender, non-distended, normal bowel sounds, no hepatosplenomegaly MSK: extremities are warm, no edema.  Skin: warm, no rash Neuro:  no focal deficits Psych: appropriate affect   Diagnostic Studies  11/2014 echo NORMAL LEFT VENTRICULAR SYSTOLIC FUNCTION WITH MODERATE LVH NORMAL LA PRESSURES WITH DIASTOLIC DYSFUNCTION NORMAL RIGHT VENTRICULAR SYSTOLIC FUNCTION VALVULAR REGURGITATION: TRIVIAL AR, MILD MR, TRIVIAL TR NO VALVULAR STENOSIS SYTTOLIC ANTERIOR MOTION OF MVL WITHOUT SIGNIFICANT LVOT OBSTRUCTION Compared with prior Echo study on 09/20/2014: DECREASED GRADIENT ACROSS LVOT FROM SAM (NOW 13 mmHg);MR HAS DECREAESD, NOW MILD.   Assessment and Plan  1. Chest pain - exertional symptoms though not consistent with exertion - abnormal EKG today with diffuse ST depression - we will plan iniitally for echo, pending results likely pursue lexiscan  2. Heart murmur - obtain echo. Murmur RUSB and at apex. Prior echo 2016 showed mild MR, apical murmur sounds louder than what would expect for just mild MR    F/u pending test results         Arnoldo Lenis, M.D.

## 2018-09-22 NOTE — Patient Instructions (Signed)
Your physician recommends that you schedule a follow-up appointment in: PENDING TEST RESULTS WITH DR BRANCH  Your physician recommends that you continue on your current medications as directed. Please refer to the Current Medication list given to you today.  Your physician has requested that you have an echocardiogram. Echocardiography is a painless test that uses sound waves to create images of your heart. It provides your doctor with information about the size and shape of your heart and how well your heart's chambers and valves are working. This procedure takes approximately one hour. There are no restrictions for this procedure.   Thank you for choosing [facility]!!    

## 2018-09-24 ENCOUNTER — Ambulatory Visit (INDEPENDENT_AMBULATORY_CARE_PROVIDER_SITE_OTHER): Payer: Medicare PPO

## 2018-09-24 DIAGNOSIS — R011 Cardiac murmur, unspecified: Secondary | ICD-10-CM

## 2018-09-30 ENCOUNTER — Telehealth: Payer: Self-pay | Admitting: *Deleted

## 2018-09-30 DIAGNOSIS — R079 Chest pain, unspecified: Secondary | ICD-10-CM

## 2018-09-30 NOTE — Telephone Encounter (Signed)
-----   Message from Arnoldo Lenis, MD sent at 09/30/2018  3:31 PM EST ----- Echo shows thickned heart muscle that we which creates some increased pressure in the heart and also creates his murmur. We often can see thicknened heart muscle in patients with kidney disease The squeezign function of the heart is nice and strong. Can we order a lexiscan for chest pain. Would start lopressor 12.5mg  bid to help relax the heart and lower some of the increased pressure   J BranchMD

## 2018-09-30 NOTE — Telephone Encounter (Signed)
LM to return call.

## 2018-10-01 MED ORDER — METOPROLOL TARTRATE 25 MG PO TABS: 12.5000 mg | ORAL_TABLET | Freq: Two times a day (BID) | ORAL | 1 refills | Status: DC

## 2018-10-01 NOTE — Telephone Encounter (Signed)
Pt voiced understanding and agreeable to stress test - does not want to schedule until March (son is having surgery) and will call back - will place orders - Lopressor sent to [facility]

## 2018-11-09 ENCOUNTER — Other Ambulatory Visit: Payer: Self-pay

## 2018-11-09 MED ORDER — METOPROLOL TARTRATE 25 MG PO TABS: 12.5000 mg | ORAL_TABLET | Freq: Two times a day (BID) | ORAL | 1 refills | Status: DC

## 2019-03-25 ENCOUNTER — Telehealth: Payer: Self-pay | Admitting: Cardiology

## 2019-03-25 NOTE — Telephone Encounter (Signed)
°  Precert needed for: Lexiscan   Location: [facility]   Date: Mar 31, 2019 arrive at 10am

## 2019-03-31 ENCOUNTER — Encounter (HOSPITAL_COMMUNITY): Payer: Self-pay

## 2019-03-31 ENCOUNTER — Other Ambulatory Visit: Payer: Self-pay

## 2019-03-31 ENCOUNTER — Encounter (HOSPITAL_COMMUNITY)
Admission: RE | Admit: 2019-03-31 | Discharge: 2019-03-31 | Disposition: A | Discharge status: Home / Self Care | Payer: Medicare PPO | Source: Ambulatory Visit | Attending: Cardiology | Admitting: Cardiology

## 2019-03-31 ENCOUNTER — Encounter (HOSPITAL_BASED_OUTPATIENT_CLINIC_OR_DEPARTMENT_OTHER)
Admission: RE | Admit: 2019-03-31 | Discharge: 2019-03-31 | Disposition: A | Discharge status: Home / Self Care | Payer: Medicare PPO | Source: Ambulatory Visit | Attending: Cardiology | Admitting: Cardiology

## 2019-03-31 DIAGNOSIS — R079 Chest pain, unspecified: Secondary | ICD-10-CM | POA: Insufficient documentation

## 2019-03-31 HISTORY — DX: Disorder of kidney and ureter, unspecified: N28.9

## 2019-03-31 LAB — NM MYOCAR MULTI W/SPECT W/WALL MOTION / EF
LV dias vol: 161 mL (ref 62–150)
LV sys vol: 67 mL
Peak HR: 71 {beats}/min
RATE: 0.3
Rest HR: 57 {beats}/min
SDS: 0
SRS: 7
SSS: 7
TID: 1.09

## 2019-03-31 MED ORDER — TECHNETIUM TC 99M TETROFOSMIN IV KIT
10.0000 | PACK | Freq: Once | INTRAVENOUS | Status: AC | PRN
  Administered 2019-03-31: 10:00:00 10.2 via INTRAVENOUS

## 2019-03-31 MED ORDER — TECHNETIUM TC 99M TETROFOSMIN IV KIT
30.0000 | PACK | Freq: Once | INTRAVENOUS | Status: AC | PRN
  Administered 2019-03-31: 30.2 via INTRAVENOUS

## 2019-03-31 MED ORDER — SODIUM CHLORIDE 0.9% FLUSH
INTRAVENOUS | Status: AC
  Administered 2019-03-31: 10 mL via INTRAVENOUS
  Filled 2019-03-31: qty 10

## 2019-03-31 MED ORDER — REGADENOSON 0.4 MG/5ML IV SOLN
INTRAVENOUS | Status: AC
  Administered 2019-03-31: 0.08 mg via INTRAVENOUS
  Filled 2019-03-31: qty 5

## 2019-04-06 ENCOUNTER — Telehealth: Payer: Self-pay | Admitting: *Deleted

## 2019-04-06 NOTE — Telephone Encounter (Signed)
LM to return call back.

## 2019-04-06 NOTE — Telephone Encounter (Signed)
-----   Message from Arnoldo Lenis, MD sent at 04/05/2019  4:04 PM EDT ----- Stress test looks good, no evidence of significant blockages. How are his symptoms doing. Needs f/u 3 months   Zandra Abts MD

## 2019-04-07 NOTE — Telephone Encounter (Signed)
Pt aware - says he still gets short winded at times - denies any worsening or new symptoms - 3 months f/u scheduled - routed to pcp

## 2019-05-07 ENCOUNTER — Other Ambulatory Visit: Payer: Self-pay | Admitting: Cardiology

## 2019-06-14 ENCOUNTER — Encounter: Payer: Self-pay | Admitting: Family Medicine

## 2019-06-14 ENCOUNTER — Ambulatory Visit (INDEPENDENT_AMBULATORY_CARE_PROVIDER_SITE_OTHER): Payer: Medicare PPO | Admitting: Family Medicine

## 2019-06-14 ENCOUNTER — Other Ambulatory Visit: Payer: Self-pay

## 2019-06-14 DIAGNOSIS — I10 Essential (primary) hypertension: Secondary | ICD-10-CM

## 2019-06-14 DIAGNOSIS — Z940 Kidney transplant status: Secondary | ICD-10-CM | POA: Diagnosis not present

## 2019-06-14 NOTE — Progress Notes (Signed)
   Subjective:  Audio  Patient ID: Scott Macias, male    DOB: [birth year], 63 y.o.   MRN: [id number]  HPI Pt had fistula removed at [facility] on Thursday afternoon (06/10/2019). Pt states the area (right lower arm) is sore but no major concerns.  Virtual Visit via Telephone Note  I connected with Scott Macias on 06/14/19 at 11:00 AM EDT by telephone and verified that I am speaking with the correct person using two identifiers.  Location: Patient: home Provider: office   I discussed the limitations, risks, security and privacy concerns of performing an evaluation and management service by telephone and the availability of in person appointments. I also discussed with the patient that there may be a patient responsible charge related to this service. The patient expressed understanding and agreed to proceed.   History of Present Illness:    Observations/Objective:   Assessment and Plan:   Follow Up Instructions:    I discussed the assessment and treatment plan with the patient. The patient was provided an opportunity to ask questions and all were answered. The patient agreed with the plan and demonstrated an understanding of the instructions.   The patient was advised to call back or seek an in-person evaluation if the symptoms worsen or if the condition fails to improve as anticipated.  20 minutes of non-face-to-face time during this encounter.   Vicente Males, LPN  Patient notes no fever at this time.  No shortness of breath.  No cough.  His left arm overall is improved.  Surgical site looks good.  No redness no discharge  Apparently handled the procedure well  Review of Systems No headache no chest pain no cough    Objective:   Physical Exam  Virtual      Assessment & Plan:  Impression status post removal of a fistula left arm.  Apparently it was causing an arteriovenous dilatation in need of intervention.  Warning signs discussed.  General questions  answered.  Increase activity encouraged.  Compliance with medications discussed

## 2019-06-24 ENCOUNTER — Ambulatory Visit: Payer: Medicare PPO | Admitting: Cardiology

## 2019-06-25 ENCOUNTER — Other Ambulatory Visit: Payer: Self-pay

## 2019-06-25 ENCOUNTER — Ambulatory Visit (INDEPENDENT_AMBULATORY_CARE_PROVIDER_SITE_OTHER): Payer: Medicare PPO | Admitting: Cardiology

## 2019-06-25 ENCOUNTER — Telehealth: Payer: Self-pay | Admitting: Cardiology

## 2019-06-25 ENCOUNTER — Encounter: Payer: Self-pay | Admitting: *Deleted

## 2019-06-25 ENCOUNTER — Encounter: Payer: Self-pay | Admitting: Cardiology

## 2019-06-25 VITALS — BP 102/64 | HR 76 | Ht 72.0 in | Wt 210.0 lb

## 2019-06-25 DIAGNOSIS — I517 Cardiomegaly: Secondary | ICD-10-CM

## 2019-06-25 DIAGNOSIS — R0789 Other chest pain: Secondary | ICD-10-CM

## 2019-06-25 NOTE — Telephone Encounter (Signed)
°  Precert needed for: Cardiac MRI   Location: [facility]    Date: Dec 1,2020 @ 12:00

## 2019-06-25 NOTE — Progress Notes (Signed)
Clinical Summary Mr. Pecor is a 63 y.o.male seen today for follow up of the following medical problems.   1. Chest pain - only with exertion. Fullness upper chest.No other associated symptoms. Lasts just few a seconds - varies on degree of exertion - episodes about once a week  CAD risk factors: HTN, HL   09/2018 echo LVEF 123XX123, grade I diastolic dysfunction, +HOCM with outflow gradient 200mg , septal wall 2.7, posterior wall 1.7  03/2019 nuclear stress: no clear ischemia  - symptoms have improved since last visit  2. ESRD - history of prior transplant in 2009. Followed at [facility]  - off dialysis for several years    3. Preop Dental work - needs to have some teeth extracted  Past Medical History:  Diagnosis Date  . GERD (gastroesophageal reflux disease)   . Gout   . Hyperlipidemia   . Hypertension   . Hypomagnesemia   . Keratosis    on the face  . Low bone density for age   . Polycystic kidney disease   . Renal insufficiency   . Umbilical hernia      Allergies  Allergen Reactions  . Amoxicillin-Pot Clavulanate     REACTION: unspecified     Current Outpatient Medications  Medication Sig Dispense Refill  . allopurinol (ZYLOPRIM) 300 MG tablet Take 300 mg by mouth daily.    Marland Kitchen aspirin 81 MG tablet Take 81 mg by mouth daily.    Marland Kitchen atorvastatin (LIPITOR) 20 MG tablet Take 20 mg by mouth daily.    . calcitRIOL (ROCALTROL) 0.5 MCG capsule Take 0.5 mcg by mouth daily.    . furosemide (LASIX) 20 MG tablet Take 20 mg by mouth every other day.     . Glucosamine-Chondroit-Vit C-Mn (GLUCOSAMINE 1500 COMPLEX) CAPS Take 1 capsule by mouth daily.    Marland Kitchen HYDROcodone-acetaminophen (NORCO) 7.5-325 MG tablet Take 1 tablet by mouth every 4 (four) hours as needed. 12 tablet 0  . magnesium oxide (MAG-OX) 400 MG tablet Take 400 mg by mouth daily.     . metoprolol tartrate (LOPRESSOR) 25 MG tablet TAKE 1/2 TABLET (12.5 MG TOTAL) BY MOUTH 2 (TWO) TIMES DAILY. NEW MEDICATION 90  tablet 1  . mycophenolate (CELLCEPT) 250 MG capsule Take by mouth 2 (two) times daily.    Marland Kitchen omeprazole (PRILOSEC) 20 MG capsule Take 20 mg by mouth daily.    . predniSONE (DELTASONE) 5 MG tablet Take 5 mg by mouth daily.    . tacrolimus (PROGRAF) 0.5 MG capsule Take 0.5 mg by mouth at bedtime.     No current facility-administered medications for this visit.      Past Surgical History:  Procedure Laterality Date  . AV FISTULA PLACEMENT    . BOWEL RESECTION    . KIDNEY TRANSPLANT    . NEPHRECTOMY TRANSPLANTED ORGAN       Allergies  Allergen Reactions  . Amoxicillin-Pot Clavulanate     REACTION: unspecified      Family History  Problem Relation Age of Onset  . CAD Father        died from complications of bypass surgery  . Kidney disease Sister        renal transplant  . Parkinson's disease Brother      Social History Mr. Trombetta reports that he has never smoked. He has never used smokeless tobacco. Mr. Whitson reports no history of alcohol use.   Review of Systems CONSTITUTIONAL: No weight loss, fever, chills, weakness or fatigue.  HEENT:  Eyes: No visual loss, blurred vision, double vision or yellow sclerae.No hearing loss, sneezing, congestion, runny nose or sore throat.  SKIN: No rash or itching.  CARDIOVASCULAR: per hpi RESPIRATORY: No shortness of breath, cough or sputum.  GASTROINTESTINAL: No anorexia, nausea, vomiting or diarrhea. No abdominal pain or blood.  GENITOURINARY: No burning on urination, no polyuria NEUROLOGICAL: No headache, dizziness, syncope, paralysis, ataxia, numbness or tingling in the extremities. No change in bowel or bladder control.  MUSCULOSKELETAL: No muscle, back pain, joint pain or stiffness.  LYMPHATICS: No enlarged nodes. No history of splenectomy.  PSYCHIATRIC: No history of depression or anxiety.  ENDOCRINOLOGIC: No reports of sweating, cold or heat intolerance. No polyuria or polydipsia.  Marland Kitchen   Physical Examination Today's Vitals    06/25/19 0906  BP: 102/64  Pulse: 76  SpO2: 97%  Weight: 210 lb (95.3 kg)  Height: 6' (1.829 m)   Body mass index is 28.48 kg/m.  Gen: resting comfortably, no acute distress HEENT: no scleral icterus, pupils equal round and reactive, no palptable cervical adenopathy,  CV: RRR, 3/6 systolic murmur rusb, no jvd Resp: Clear to auscultation bilaterally GI: abdomen is soft, non-tender, non-distended, normal bowel sounds, no hepatosplenomegaly MSK: extremities are warm, no edema.  Skin: warm, no rash Neuro:  no focal deficits Psych: appropriate affect   Diagnostic Studies 11/2014 echo NORMAL LEFT VENTRICULAR SYSTOLIC FUNCTION WITH MODERATE LVH NORMAL LA PRESSURES WITH DIASTOLIC DYSFUNCTION NORMAL RIGHT VENTRICULAR SYSTOLIC FUNCTION VALVULAR REGURGITATION: TRIVIAL AR, MILD MR, TRIVIAL TR NO VALVULAR STENOSIS SYTTOLIC ANTERIOR MOTION OF MVL WITHOUT SIGNIFICANT LVOT OBSTRUCTION Compared with prior Echo study on 09/20/2014: DECREASED GRADIENT ACROSS LVOT FROM SAM (NOW 13 mmHg);MR HAS DECREAESD, NOW MILD.   09/2018 echo IMPRESSIONS    1. The left ventricle has hyperdynamic systolic function of 123XX123. The cavity size was normal. There is moderately to severely increased left ventricular wall thickness. Echo evidence of impaired diastolic relaxation. Turbulent flow in LVOT in setting of  mitral SAM. Dynamic outflow gradient near 200 mmHg with Valsalva. Consistent with hypertrophic cardiomyopathy.  2. The right ventricle has normal systolic function. The cavity was normal. There is no increase in right ventricular wall thickness.  3. Left atrial size was severely dilated.  4. Small pericardial effusion.  5. The pericardial effusion is posterior to the left ventricle.  6. Mitral chordal SAM noted resulting in restricted anterior leaflet motion. Mitral valve regurgitation is moderate by color flow Doppler. The MR jet is eccentric laterally directed. PISA was not obtained.   7. The aortic valve is tricuspid There is mild aortic annular calcification noted.  8. The tricuspid valve is normal in structure.  9. The aortic root is normal in size and structure.  03/2019 nuclear stress   Defect 1: There is a medium defect of mild severity present in the mid inferolateral and apical inferior location. I suspect this is due to soft tissue attenuation. However, a mild degree of scar cannot entirely be ruled out.  This is a low risk study. No significant ischemic zones.  Nuclear stress EF: 58%.  Nonspecific ST segment depressions in inferolateral leads seen at baseline and throughout study.    Assessment and Plan  1. Chest pain/ LVH with dynamic gradient - negative stress test - echo showed significant LVH with high dynamic LVOT gradient. STarted on metoprol, symptoms have improved - on my review hypertrophy looks fairly symmetric despite differences in reported measurements. Unclear if hypertrophy due to prior kidney disease or in a pattern more consistent  with hypertrophic cardiomyopathy. Will get cardiac MRI to better evaluate, as if more consistent with hypertrophic CM would need stratification measueres for SCD as well as family screening.       Arnoldo Lenis, M.D.

## 2019-06-25 NOTE — Patient Instructions (Signed)
Your physician wants you to follow-up in: Veteran will receive a reminder letter in the mail two months in advance. If you don't receive a letter, please call our office to schedule the follow-up appointment.  Your physician recommends that you continue on your current medications as directed. Please refer to the Current Medication list given to you today.  Your physician has requested that you have a cardiac MRI. Cardiac MRI uses a computer to create images of your heart as its beating, producing both still and moving pictures of your heart and major blood vessels. For further information please visit [email or web address] Please follow the instruction sheet given to you today for more information.  Thank you for choosing [facility]!!

## 2019-07-19 ENCOUNTER — Telehealth (HOSPITAL_COMMUNITY): Payer: Self-pay | Admitting: Emergency Medicine

## 2019-07-19 ENCOUNTER — Encounter (HOSPITAL_COMMUNITY): Payer: Self-pay

## 2019-07-19 NOTE — Telephone Encounter (Signed)
Reaching out to patient to offer assistance regarding upcoming cardiac imaging study; pt verbalizes understanding of appt date/time, parking situation and where to check in, and verified current allergies; name and call back number provided for further questions should they arise Marchia Bond RN Navigator Cardiac Imaging [facility] [phone] office [phone] cell  Pt questioning gadavist with history of kidney transplant in 2009( [facility]). Will refer to reading MD.

## 2019-07-19 NOTE — Telephone Encounter (Signed)
I would be ok with Gadavist as long as GFR > 30, think Charla said they would re-draw his BMET.

## 2019-07-20 ENCOUNTER — Ambulatory Visit (HOSPITAL_COMMUNITY): Admission: RE | Admit: 2019-07-20 | Payer: Medicare PPO | Source: Ambulatory Visit

## 2019-07-20 ENCOUNTER — Encounter (HOSPITAL_COMMUNITY): Payer: Self-pay

## 2019-07-21 ENCOUNTER — Telehealth (HOSPITAL_COMMUNITY): Payer: Self-pay | Admitting: Emergency Medicine

## 2019-07-21 NOTE — Telephone Encounter (Signed)
Spoke with patient about cMR; patient states he talked with his [facility] nephrologist who suggested he have the test done at [facility] where they use Fereheme as contrast instead of gadavist.   Marchia Bond RN Navigator Cardiac Imaging [facility] [phone] Office  [phone] Cell

## 2019-07-22 ENCOUNTER — Telehealth: Payer: Self-pay | Admitting: Cardiology

## 2019-07-22 NOTE — Telephone Encounter (Signed)
Ok to cancel cardiac MRI at [facility], patient will have done at [facility] in order to use alternative contrast agent given his renal disease. He is to contact his nephrolgist there to arrange   Zandra Abts MD

## 2019-07-22 NOTE — Telephone Encounter (Signed)
Contacted patient to discuss cardiac  MRI, he had some concerns about the use of gadavist and his kidney function. We discussed in detail the use of gadolinium contrast in patients with renal disease. We discussed the major concern is a rare but severe side effect call neprhogenic systemic fibrosis, and that with the older gadolinium agents the risk was higher but the new agents such as gadavist the risk is quite low. From literature review there have been only 11 reported cases out of 6 million doses of gadavist, and only 3 of those were unconfounded cases. Additional literature review showed a study of approx 900 patients with moderate to severe renal disease who experienced no significant adverse reactions to gadavist. With his GFR of 34 there is no contraindication to using gadavist. Given this information, he would still prefer to avoid using gadavist  and arrange a cardiac MRI using FERAHEME at [facility], since he has access to [facility] through his nephrologist, Shirlean Kelly would essentially have no risk from a renal standpoint. He will contact his nephrologist to arrange since I do not have ability to order.    Carlyle Dolly MD

## 2019-08-25 DIAGNOSIS — L570 Actinic keratosis: Secondary | ICD-10-CM | POA: Diagnosis not present

## 2019-08-25 DIAGNOSIS — Z85828 Personal history of other malignant neoplasm of skin: Secondary | ICD-10-CM | POA: Diagnosis not present

## 2019-09-08 DIAGNOSIS — N2581 Secondary hyperparathyroidism of renal origin: Secondary | ICD-10-CM | POA: Diagnosis not present

## 2019-09-08 DIAGNOSIS — E782 Mixed hyperlipidemia: Secondary | ICD-10-CM | POA: Diagnosis not present

## 2019-09-08 DIAGNOSIS — Z940 Kidney transplant status: Secondary | ICD-10-CM | POA: Diagnosis not present

## 2019-09-08 DIAGNOSIS — I770 Arteriovenous fistula, acquired: Secondary | ICD-10-CM | POA: Diagnosis not present

## 2019-09-08 DIAGNOSIS — M109 Gout, unspecified: Secondary | ICD-10-CM | POA: Diagnosis not present

## 2019-09-08 DIAGNOSIS — N183 Chronic kidney disease, stage 3 unspecified: Secondary | ICD-10-CM | POA: Diagnosis not present

## 2019-09-08 DIAGNOSIS — Z87718 Personal history of other specified (corrected) congenital malformations of genitourinary system: Secondary | ICD-10-CM | POA: Diagnosis not present

## 2019-09-08 DIAGNOSIS — I129 Hypertensive chronic kidney disease with stage 1 through stage 4 chronic kidney disease, or unspecified chronic kidney disease: Secondary | ICD-10-CM | POA: Diagnosis not present

## 2019-09-20 DIAGNOSIS — R0600 Dyspnea, unspecified: Secondary | ICD-10-CM | POA: Diagnosis not present

## 2019-09-20 DIAGNOSIS — I421 Obstructive hypertrophic cardiomyopathy: Secondary | ICD-10-CM | POA: Diagnosis not present

## 2019-09-23 ENCOUNTER — Encounter: Payer: Self-pay | Admitting: Family Medicine

## 2019-10-11 DIAGNOSIS — H401222 Low-tension glaucoma, left eye, moderate stage: Secondary | ICD-10-CM | POA: Diagnosis not present

## 2019-10-11 DIAGNOSIS — H401213 Low-tension glaucoma, right eye, severe stage: Secondary | ICD-10-CM | POA: Diagnosis not present

## 2019-10-15 DIAGNOSIS — R0600 Dyspnea, unspecified: Secondary | ICD-10-CM | POA: Diagnosis not present

## 2019-10-15 DIAGNOSIS — I421 Obstructive hypertrophic cardiomyopathy: Secondary | ICD-10-CM | POA: Diagnosis not present

## 2019-10-19 NOTE — Telephone Encounter (Signed)
Patient advised that the echo was released and that the reason why he can't see it is unknown. Patient request copy of echo from 09/2018 be mailed to his home address. Advised that copy would be sent.

## 2019-10-20 DIAGNOSIS — M25572 Pain in left ankle and joints of left foot: Secondary | ICD-10-CM | POA: Diagnosis not present

## 2019-11-08 DIAGNOSIS — Z940 Kidney transplant status: Secondary | ICD-10-CM | POA: Diagnosis not present

## 2019-11-08 DIAGNOSIS — D849 Immunodeficiency, unspecified: Secondary | ICD-10-CM | POA: Diagnosis not present

## 2019-11-08 DIAGNOSIS — I10 Essential (primary) hypertension: Secondary | ICD-10-CM | POA: Diagnosis not present

## 2019-11-29 DIAGNOSIS — I421 Obstructive hypertrophic cardiomyopathy: Secondary | ICD-10-CM | POA: Diagnosis not present

## 2019-12-10 DIAGNOSIS — B349 Viral infection, unspecified: Secondary | ICD-10-CM | POA: Diagnosis not present

## 2019-12-10 DIAGNOSIS — Z20828 Contact with and (suspected) exposure to other viral communicable diseases: Secondary | ICD-10-CM | POA: Diagnosis not present

## 2019-12-11 DIAGNOSIS — R531 Weakness: Secondary | ICD-10-CM | POA: Diagnosis not present

## 2019-12-11 DIAGNOSIS — Z940 Kidney transplant status: Secondary | ICD-10-CM | POA: Diagnosis not present

## 2019-12-11 DIAGNOSIS — K219 Gastro-esophageal reflux disease without esophagitis: Secondary | ICD-10-CM | POA: Diagnosis not present

## 2019-12-11 DIAGNOSIS — M109 Gout, unspecified: Secondary | ICD-10-CM | POA: Diagnosis not present

## 2019-12-11 DIAGNOSIS — R5383 Other fatigue: Secondary | ICD-10-CM | POA: Diagnosis not present

## 2019-12-11 DIAGNOSIS — R944 Abnormal results of kidney function studies: Secondary | ICD-10-CM | POA: Diagnosis not present

## 2019-12-11 DIAGNOSIS — R778 Other specified abnormalities of plasma proteins: Secondary | ICD-10-CM | POA: Diagnosis not present

## 2019-12-11 DIAGNOSIS — R197 Diarrhea, unspecified: Secondary | ICD-10-CM | POA: Diagnosis not present

## 2019-12-11 DIAGNOSIS — K5730 Diverticulosis of large intestine without perforation or abscess without bleeding: Secondary | ICD-10-CM | POA: Diagnosis not present

## 2019-12-11 DIAGNOSIS — Z20822 Contact with and (suspected) exposure to covid-19: Secondary | ICD-10-CM | POA: Diagnosis not present

## 2019-12-11 DIAGNOSIS — R7989 Other specified abnormal findings of blood chemistry: Secondary | ICD-10-CM | POA: Diagnosis not present

## 2019-12-11 DIAGNOSIS — I421 Obstructive hypertrophic cardiomyopathy: Secondary | ICD-10-CM | POA: Diagnosis not present

## 2019-12-11 DIAGNOSIS — D849 Immunodeficiency, unspecified: Secondary | ICD-10-CM | POA: Diagnosis not present

## 2019-12-11 DIAGNOSIS — R5381 Other malaise: Secondary | ICD-10-CM | POA: Diagnosis not present

## 2019-12-11 DIAGNOSIS — R0600 Dyspnea, unspecified: Secondary | ICD-10-CM | POA: Diagnosis not present

## 2019-12-11 DIAGNOSIS — R509 Fever, unspecified: Secondary | ICD-10-CM | POA: Diagnosis not present

## 2019-12-11 DIAGNOSIS — H409 Unspecified glaucoma: Secondary | ICD-10-CM | POA: Diagnosis not present

## 2019-12-11 DIAGNOSIS — Q612 Polycystic kidney, adult type: Secondary | ICD-10-CM | POA: Diagnosis not present

## 2019-12-14 ENCOUNTER — Encounter: Payer: Self-pay | Admitting: Family Medicine

## 2019-12-16 MED ORDER — PANTOPRAZOLE SODIUM 40 MG PO TBEC
40.00 | DELAYED_RELEASE_TABLET | ORAL | Status: DC
Start: 2019-12-17 — End: 2019-12-16

## 2019-12-16 MED ORDER — LORATADINE 10 MG PO TABS
10.00 | ORAL_TABLET | ORAL | Status: DC
Start: ? — End: 2019-12-16

## 2019-12-16 MED ORDER — MELATONIN 3 MG PO TABS
3.00 | ORAL_TABLET | ORAL | Status: DC
Start: ? — End: 2019-12-16

## 2019-12-16 MED ORDER — LATANOPROST 0.005 % OP SOLN
1.00 | OPHTHALMIC | Status: DC
Start: 2019-12-16 — End: 2019-12-16

## 2019-12-16 MED ORDER — ASPIRIN 81 MG PO TBEC
162.00 | DELAYED_RELEASE_TABLET | ORAL | Status: DC
Start: 2019-12-17 — End: 2019-12-16

## 2019-12-16 MED ORDER — SIMETHICONE 80 MG PO CHEW
80.00 | CHEWABLE_TABLET | ORAL | Status: DC
Start: ? — End: 2019-12-16

## 2019-12-16 MED ORDER — SENNOSIDES-DOCUSATE SODIUM 8.6-50 MG PO TABS
2.00 | ORAL_TABLET | ORAL | Status: DC
Start: ? — End: 2019-12-16

## 2019-12-16 MED ORDER — ALLOPURINOL 300 MG PO TABS
300.00 | ORAL_TABLET | ORAL | Status: DC
Start: 2019-12-17 — End: 2019-12-16

## 2019-12-16 MED ORDER — ACETAMINOPHEN 325 MG PO TABS
650.00 | ORAL_TABLET | ORAL | Status: DC
Start: ? — End: 2019-12-16

## 2019-12-16 MED ORDER — PREDNISONE 5 MG PO TABS
5.00 | ORAL_TABLET | ORAL | Status: DC
Start: 2019-12-17 — End: 2019-12-16

## 2019-12-16 MED ORDER — GENERIC EXTERNAL MEDICATION
Status: DC
Start: ? — End: 2019-12-16

## 2019-12-16 MED ORDER — TACROLIMUS 1 MG PO CAPS
1.00 | ORAL_CAPSULE | ORAL | Status: DC
Start: 2019-12-16 — End: 2019-12-16

## 2019-12-16 MED ORDER — LIDOCAINE HCL 1 % IJ SOLN
0.50 | INTRAMUSCULAR | Status: DC
Start: ? — End: 2019-12-16

## 2019-12-16 MED ORDER — MIDAZOLAM HCL 5 MG/5ML IJ SOLN
INTRAMUSCULAR | Status: DC
Start: ? — End: 2019-12-16

## 2019-12-16 MED ORDER — MAGNESIUM OXIDE 400 MG PO TABS
400.00 | ORAL_TABLET | ORAL | Status: DC
Start: 2019-12-16 — End: 2019-12-16

## 2019-12-16 MED ORDER — FENTANYL CITRATE (PF) 50 MCG/ML IJ SOLN
INTRAMUSCULAR | Status: DC
Start: ? — End: 2019-12-16

## 2019-12-16 MED ORDER — MYCOPHENOLATE MOFETIL 250 MG PO CAPS
250.00 | ORAL_CAPSULE | ORAL | Status: DC
Start: 2019-12-16 — End: 2019-12-16

## 2019-12-16 MED ORDER — METOPROLOL TARTRATE 25 MG PO TABS
25.00 | ORAL_TABLET | ORAL | Status: DC
Start: 2019-12-16 — End: 2019-12-16

## 2019-12-16 MED ORDER — ALUM & MAG HYDROXIDE-SIMETH 400-400-40 MG/5ML PO SUSP
15.00 | ORAL | Status: DC
Start: ? — End: 2019-12-16

## 2019-12-16 MED ORDER — ATORVASTATIN CALCIUM 10 MG PO TABS
20.00 | ORAL_TABLET | ORAL | Status: DC
Start: 2019-12-16 — End: 2019-12-16

## 2019-12-16 MED ORDER — CALCITRIOL 0.25 MCG PO CAPS
1.00 | ORAL_CAPSULE | ORAL | Status: DC
Start: 2019-12-18 — End: 2019-12-16

## 2019-12-16 MED ORDER — CALCITRIOL 0.25 MCG PO CAPS
0.50 | ORAL_CAPSULE | ORAL | Status: DC
Start: ? — End: 2019-12-16

## 2019-12-17 ENCOUNTER — Telehealth: Payer: Self-pay | Admitting: Cardiology

## 2019-12-17 DIAGNOSIS — H1132 Conjunctival hemorrhage, left eye: Secondary | ICD-10-CM | POA: Diagnosis not present

## 2019-12-17 NOTE — Telephone Encounter (Signed)
Contacted patient to schedule appointment from recall patient stated he is no seeing a cardiologist at [facility] and would no longer need to follow up with our office

## 2020-01-05 DIAGNOSIS — N183 Chronic kidney disease, stage 3 unspecified: Secondary | ICD-10-CM | POA: Diagnosis not present

## 2020-01-05 DIAGNOSIS — E782 Mixed hyperlipidemia: Secondary | ICD-10-CM | POA: Diagnosis not present

## 2020-01-05 DIAGNOSIS — Z940 Kidney transplant status: Secondary | ICD-10-CM | POA: Diagnosis not present

## 2020-01-05 DIAGNOSIS — N2581 Secondary hyperparathyroidism of renal origin: Secondary | ICD-10-CM | POA: Diagnosis not present

## 2020-01-05 DIAGNOSIS — I770 Arteriovenous fistula, acquired: Secondary | ICD-10-CM | POA: Diagnosis not present

## 2020-01-05 DIAGNOSIS — Z87718 Personal history of other specified (corrected) congenital malformations of genitourinary system: Secondary | ICD-10-CM | POA: Diagnosis not present

## 2020-01-05 DIAGNOSIS — I129 Hypertensive chronic kidney disease with stage 1 through stage 4 chronic kidney disease, or unspecified chronic kidney disease: Secondary | ICD-10-CM | POA: Diagnosis not present

## 2020-01-05 DIAGNOSIS — M109 Gout, unspecified: Secondary | ICD-10-CM | POA: Diagnosis not present

## 2020-02-09 DIAGNOSIS — H401213 Low-tension glaucoma, right eye, severe stage: Secondary | ICD-10-CM | POA: Diagnosis not present

## 2020-02-09 DIAGNOSIS — H401222 Low-tension glaucoma, left eye, moderate stage: Secondary | ICD-10-CM | POA: Diagnosis not present

## 2020-02-10 DIAGNOSIS — N179 Acute kidney failure, unspecified: Secondary | ICD-10-CM | POA: Diagnosis not present

## 2020-02-10 DIAGNOSIS — D849 Immunodeficiency, unspecified: Secondary | ICD-10-CM | POA: Diagnosis not present

## 2020-02-10 DIAGNOSIS — Z940 Kidney transplant status: Secondary | ICD-10-CM | POA: Diagnosis not present

## 2020-02-10 DIAGNOSIS — I10 Essential (primary) hypertension: Secondary | ICD-10-CM | POA: Diagnosis not present

## 2020-10-18 ENCOUNTER — Other Ambulatory Visit: Payer: Self-pay

## 2020-10-18 ENCOUNTER — Ambulatory Visit (INDEPENDENT_AMBULATORY_CARE_PROVIDER_SITE_OTHER): Payer: Medicare PPO | Admitting: Family Medicine

## 2020-10-18 ENCOUNTER — Encounter: Payer: Self-pay | Admitting: Family Medicine

## 2020-10-18 VITALS — BP 118/78 | HR 66 | Temp 97.2°F | Ht 71.0 in | Wt 205.8 lb

## 2020-10-18 DIAGNOSIS — I10 Essential (primary) hypertension: Secondary | ICD-10-CM

## 2020-10-18 DIAGNOSIS — M109 Gout, unspecified: Secondary | ICD-10-CM | POA: Diagnosis not present

## 2020-10-18 DIAGNOSIS — Z940 Kidney transplant status: Secondary | ICD-10-CM

## 2020-10-18 DIAGNOSIS — I421 Obstructive hypertrophic cardiomyopathy: Secondary | ICD-10-CM

## 2020-10-18 NOTE — Progress Notes (Signed)
Patient ID: Scott Macias, male    DOB: [birth year], 65 y.o.   MRN: [id number]   Chief Complaint  Patient presents with  . Annual Exam   Subjective:    HPI AWV- Annual Wellness Visit  The patient was seen for their annual wellness visit. The patient's past medical history, surgical history, and family history were reviewed. Pertinent vaccines were reviewed ( tetanus, pneumonia, shingles, flu) The patient's medication list was reviewed and updated.  The height and weight were entered.  BMI recorded in electronic record elsewhere  Cognitive screening was completed. Outcome of Mini - Cog: pass   Falls /depression screening electronically recorded within record elsewhere  Current tobacco usage:no (All patients who use tobacco were given written and verbal information on quitting)  Recent listing of emergency department/hospitalizations over the past year were reviewed.  current specialist the patient sees on a regular basis: no   Medicare annual wellness visit patient questionnaire was reviewed.  A written screening schedule for the patient for the next 5-10 years was given. Appropriate discussion of followup regarding next visit was discussed.  Doing well, not sick with covid recently.  Had it in 12/21. midl wit "cold symptoms." Went to urgent care and had testing was positive.  Had monoclonal ab.   Cr has been around 1.6, stable had renal tranasplant. These are good for him, improving. Seeing transplant team at [facility]. Dr. Tamala Macias. Has had multiple surgeries on stomach and hernia on abd from scaring and midline scar. They think best to leave it alone.  Sob with exertion with going up stairs.  Wondering if the scaring is pushing up on lungs and causing the sob.  Not working out as much as he used to. At times sob with talking.  Has h/o murmur and hocm. The fistual in left arm is reversed.  Pt brought labs and doing well.   [name] has a past medical  history of GERD (gastroesophageal reflux disease), Gout, Hyperlipidemia, Hypertension, Hypomagnesemia, Keratosis, Low bone density for age, Polycystic kidney disease, Renal insufficiency, and Umbilical hernia.   Outpatient Encounter Medications as of 10/18/2020  Medication Sig  . allopurinol (ZYLOPRIM) 300 MG tablet Take 300 mg by mouth daily.  Marland Kitchen aspirin 81 MG tablet Take 81 mg by mouth daily.  Marland Kitchen atorvastatin (LIPITOR) 20 MG tablet Take 20 mg by mouth daily.  . calcitRIOL (ROCALTROL) 0.5 MCG capsule Take 0.5 mcg by mouth daily.  . Glucosamine-Chondroit-Vit C-Mn (GLUCOSAMINE 1500 COMPLEX) CAPS Take 1 capsule by mouth daily.  . magnesium oxide (MAG-OX) 400 MG tablet Take 400 mg by mouth daily.   . metoprolol tartrate (LOPRESSOR) 25 MG tablet TAKE 1/2 TABLET (12.5 MG TOTAL) BY MOUTH 2 (TWO) TIMES DAILY. NEW MEDICATION  . mycophenolate (CELLCEPT) 250 MG capsule Take by mouth 2 (two) times daily.  Marland Kitchen omeprazole (PRILOSEC) 20 MG capsule Take 20 mg by mouth daily.  . predniSONE (DELTASONE) 5 MG tablet Take 5 mg by mouth daily.  . tacrolimus (PROGRAF) 0.5 MG capsule Take 0.5 mg by mouth at bedtime.   No facility-administered encounter medications on file as of 10/18/2020.     Review of Systems  Constitutional: Negative for chills and fever.  HENT: Negative for congestion, rhinorrhea and sore throat.   Respiratory: Negative for cough, shortness of breath and wheezing.   Cardiovascular: Negative for chest pain and leg swelling.  Gastrointestinal: Negative for abdominal pain, diarrhea, nausea and vomiting.  Genitourinary: Negative for dysuria and frequency.  Skin: Negative for  rash.  Neurological: Negative for dizziness, weakness and headaches.     Vitals BP 118/78   Pulse 66   Temp (!) 97.2 F (36.2 C)   Ht 5\' 11"  (1.803 m)   Wt 205 lb 12.8 oz (93.4 kg)   BMI 28.70 kg/m   Objective:   Physical Exam Vitals and nursing note reviewed.  Constitutional:      General: He is not in acute  distress.    Appearance: Normal appearance. He is not ill-appearing.  HENT:     Head: Normocephalic.     Nose: Nose normal. No congestion.     Mouth/Throat:     Mouth: Mucous membranes are moist.     Pharynx: No oropharyngeal exudate.  Eyes:     Extraocular Movements: Extraocular movements intact.     Conjunctiva/sclera: Conjunctivae normal.     Pupils: Pupils are equal, round, and reactive to light.  Cardiovascular:     Rate and Rhythm: Normal rate and regular rhythm.     Pulses: Normal pulses.     Heart sounds: Murmur heard.    Pulmonary:     Effort: Pulmonary effort is normal.     Breath sounds: Normal breath sounds. No wheezing, rhonchi or rales.  Musculoskeletal:        General: Normal range of motion.     Right lower leg: No edema.     Left lower leg: No edema.  Skin:    General: Skin is warm and dry.     Findings: No rash.  Neurological:     General: No focal deficit present.     Mental Status: He is alert and oriented to person, place, and time.     Cranial Nerves: No cranial nerve deficit.  Psychiatric:        Mood and Affect: Mood normal.        Behavior: Behavior normal.        Thought Content: Thought content normal.        Judgment: Judgment normal.     Assessment and Plan   1. Kidney transplant recipient  2. HOCM (hypertrophic obstructive cardiomyopathy) ([place])  3. Essential hypertension  4. Gout, unspecified cause, unspecified chronicity, unspecified site   Gout -stable.   Renal transplant- stable. cont f/u with transplant team at [facility].  Hocm/htn/MV regurg-stable.  cont f/u with cards. cont meds.  HM- Colonoscopy in 10 yrs 2028. Had 1st covid vaccine 4/21 and then went in hosp and no further ones after the first vaccine.   Return in about 1 year (around 10/18/2021) for medicare well.

## 2020-10-29 DIAGNOSIS — I421 Obstructive hypertrophic cardiomyopathy: Secondary | ICD-10-CM | POA: Insufficient documentation

## 2020-12-11 ENCOUNTER — Telehealth: Payer: Self-pay | Admitting: *Deleted

## 2020-12-11 NOTE — Telephone Encounter (Signed)
Transition Care Management Unsuccessful Follow-up Telephone Call  Date of discharge and from where:  12/10/2020 - [facility]  Attempts:  1st Attempt  Reason for unsuccessful TCM follow-up call:  Left voice message 

## 2020-12-12 NOTE — Telephone Encounter (Signed)
Transition Care Management Unsuccessful Follow-up Telephone Call  Date of discharge and from where:  12/10/2020 [facility]  Attempts:  2nd Attempt  Reason for unsuccessful TCM follow-up call:  Left voice message

## 2020-12-13 NOTE — Telephone Encounter (Signed)
Transition Care Management Follow-up Telephone Call  Date of discharge and from where: 12/10/2020 [facility]  How have you been since you were released from the hospital? "Doing better"  Any questions or concerns? No  Items Reviewed:  Did the pt receive and understand the discharge instructions provided? Yes   Medications obtained and verified? Yes   Other? No   Any new allergies since your discharge? No   Dietary orders reviewed? No  Do you have support at home? Yes    Functional Questionnaire: (I = Independent and D = Dependent) ADLs: I  Bathing/Dressing- I  Meal Prep- I  Eating- I  Maintaining continence- I  Transferring/Ambulation- I  Managing Meds- I  Follow up appointments reviewed:   PCP Hospital f/u appt confirmed? No    Specialist Hospital f/u appt confirmed? No    Are transportation arrangements needed? No   If their condition worsens, is the pt aware to call PCP or go to the Emergency Dept.? Yes  Was the patient provided with contact information for the PCP's office or ED? Yes  Was to pt encouraged to call back with questions or concerns? Yes

## 2021-01-12 DIAGNOSIS — N183 Chronic kidney disease, stage 3 unspecified: Secondary | ICD-10-CM | POA: Insufficient documentation

## 2021-01-12 DIAGNOSIS — D47Z1 Post-transplant lymphoproliferative disorder (PTLD): Secondary | ICD-10-CM | POA: Insufficient documentation

## 2022-04-12 ENCOUNTER — Ambulatory Visit (INDEPENDENT_AMBULATORY_CARE_PROVIDER_SITE_OTHER): Payer: Medicare PPO | Admitting: Family Medicine

## 2022-04-12 VITALS — BP 109/75 | Ht 72.0 in | Wt 206.2 lb

## 2022-04-12 DIAGNOSIS — E785 Hyperlipidemia, unspecified: Secondary | ICD-10-CM

## 2022-04-12 DIAGNOSIS — I421 Obstructive hypertrophic cardiomyopathy: Secondary | ICD-10-CM

## 2022-04-12 DIAGNOSIS — Z940 Kidney transplant status: Secondary | ICD-10-CM

## 2022-04-12 DIAGNOSIS — I10 Essential (primary) hypertension: Secondary | ICD-10-CM

## 2022-04-12 DIAGNOSIS — K219 Gastro-esophageal reflux disease without esophagitis: Secondary | ICD-10-CM

## 2022-04-12 NOTE — Patient Instructions (Signed)
Continue your meds.  Follow up in 6 months.  Take care  Dr. Cambreigh Dearing  

## 2022-04-15 DIAGNOSIS — M21379 Foot drop, unspecified foot: Secondary | ICD-10-CM | POA: Insufficient documentation

## 2022-04-15 NOTE — Assessment & Plan Note (Signed)
Stable.  Continue Metoprolol.

## 2022-04-15 NOTE — Progress Notes (Signed)
Subjective:  Patient ID: Scott Macias, male    DOB: July 14, 1956  Age: 66 y.o. MRN: [id number]  CC: Chief Complaint  Patient presents with   Establish Care    Follow up from recent hospitalization and surgery 2 weeks ago- has follow up with surgeon 04/24/22 Patient had gallbladder rwmoved    HPI:  66 year old male with a complicated PMH presents to establish care with me.   Patient is followed by multiple specialists at [facility], Neurology, Cardiology, Nephrology).   Has had recent hospitalization (see Care Everywhere). Had RUQ abdominal pain. Was septic. Found to have choledocholithiasis. Had ERCP and cholecystectomy.  He reports some abdominal discomfort from recent surgery. Otherwise doing well.   HTN is well controlled on Metoprolol.   He is on Tacrolimus, Cellcept, and Prednisone due to kidney transplant. Would like me to order Tacrolimus level.   Patient Active Problem List   Diagnosis Date Noted   Foot drop 04/15/2022   Hyperlipidemia 04/12/2022   PTLD (post-transplant lymphoproliferative disorder) ([place]) 01/12/2021   CKD (chronic kidney disease) stage 3, GFR 30-59 ml/min ([facility]) 01/12/2021   HOCM (hypertrophic obstructive cardiomyopathy) ([place]) 10/29/2020   Kidney transplant recipient 02/06/2015   Mitral valve regurgitation 02/06/2015   Essential hypertension 10/18/2009   GERD 10/18/2009    Social Hx   Social History   Socioeconomic History   Marital status: Married    Spouse name: Not on file   Number of children: Not on file   Years of education: Not on file   Highest education level: Not on file  Occupational History   Not on file  Tobacco Use   Smoking status: Never   Smokeless tobacco: Never  Substance and Sexual Activity   Alcohol use: No    Alcohol/week: 0.0 standard drinks of alcohol   Drug use: No   Sexual activity: Not on file  Other Topics Concern   Not on file  Social History Narrative   Not on file   Social Determinants of Health    Financial Resource Strain: Not on file  Food Insecurity: Not on file  Transportation Needs: Not on file  Physical Activity: Not on file  Stress: Not on file  Social Connections: Not on file    Review of Systems Per HPI  Objective:  BP 109/75   Ht 6' (1.829 m)   Wt 206 lb 3.2 oz (93.5 kg)   BMI 27.97 kg/m      04/12/2022    3:18 PM 10/18/2020    9:25 AM 06/25/2019    9:06 AM  BP/Weight  Systolic BP 458 099 833  Diastolic BP 75 78 64  Wt. (Lbs) 206.2 205.8 210  BMI 27.97 kg/m2 28.7 kg/m2 28.48 kg/m2    Physical Exam Constitutional:      General: He is not in acute distress.    Appearance: Normal appearance.  HENT:     Head: Normocephalic and atraumatic.  Eyes:     General:        Right eye: No discharge.        Left eye: No discharge.     Conjunctiva/sclera: Conjunctivae normal.  Cardiovascular:     Rate and Rhythm: Normal rate and regular rhythm.  Pulmonary:     Effort: Pulmonary effort is normal.     Breath sounds: Normal breath sounds. No wheezing or rales.  Abdominal:     General: There is no distension.     Palpations: Abdomen is soft.  Tenderness: There is no abdominal tenderness.  Neurological:     Mental Status: He is alert.  Psychiatric:        Mood and Affect: Mood normal.        Behavior: Behavior normal.      Assessment & Plan:   Problem List Items Addressed This Visit       Cardiovascular and Mediastinum   Essential hypertension    Stable. Continue Metoprolol.       HOCM (hypertrophic obstructive cardiomyopathy) ([place])    Needs to continue close follow up with Cardiology especially in the setting of severe MR.        Other   Hyperlipidemia    Last LDL was 91 (2022). Continue Statin.       Kidney transplant recipient - Primary    Order Tacrolimus level per patient request.       Relevant Orders   Tacrolimus Level    Follow-up:  6 months  [place] DO [place]

## 2022-04-15 NOTE — Assessment & Plan Note (Signed)
Needs to continue close follow up with Cardiology especially in the setting of severe MR.

## 2022-04-15 NOTE — Assessment & Plan Note (Signed)
Last LDL was 91 (2022). Continue Statin.

## 2022-04-15 NOTE — Assessment & Plan Note (Signed)
Order Tacrolimus level per patient request.

## 2022-04-21 LAB — TACROLIMUS LEVEL: Tacrolimus (FK506), Blood: 4.8 ng/mL (ref 2.0–20.0)

## 2022-06-13 ENCOUNTER — Ambulatory Visit (INDEPENDENT_AMBULATORY_CARE_PROVIDER_SITE_OTHER): Payer: Medicare PPO

## 2022-06-13 VITALS — BP 123/85 | Ht 72.0 in | Wt 206.0 lb

## 2022-06-13 DIAGNOSIS — Z0000 Encounter for general adult medical examination without abnormal findings: Secondary | ICD-10-CM

## 2022-06-13 NOTE — Patient Instructions (Signed)
Scott Macias , Thank you for taking time to come for your Medicare Wellness Visit. I appreciate your ongoing commitment to your health goals. Please review the following plan we discussed and let me know if I can assist you in the future.   Screening recommendations/referrals: Colonoscopy: 07/22/17 Recommended yearly ophthalmology/optometry visit for glaucoma screening and checkup Recommended yearly dental visit for hygiene and checkup  Vaccinations: Influenza vaccine: 05/14/21 Pneumococcal vaccine: n/d Tdap vaccine: n/d Shingles vaccine: n/d   Covid-19: 11/29/19  Advanced directives: no  Conditions/risks identified: none  Next appointment: Follow up in one year for your annual wellness visit. 06/24/23 @ 1:30 pm by phone  Preventive Care 65 Years and Older, Male Preventive care refers to lifestyle choices and visits with your health care provider that can promote health and wellness. What does preventive care include? A yearly physical exam. This is also called an annual well check. Dental exams once or twice a year. Routine eye exams. Ask your health care provider how often you should have your eyes checked. Personal lifestyle choices, including: Daily care of your teeth and gums. Regular physical activity. Eating a healthy diet. Avoiding tobacco and drug use. Limiting alcohol use. Practicing safe sex. Taking low doses of aspirin every day. Taking vitamin and mineral supplements as recommended by your health care provider. What happens during an annual well check? The services and screenings done by your health care provider during your annual well check will depend on your age, overall health, lifestyle risk factors, and family history of disease. Counseling  Your health care provider may ask you questions about your: Alcohol use. Tobacco use. Drug use. Emotional well-being. Home and relationship well-being. Sexual activity. Eating habits. History of falls. Memory and  ability to understand (cognition). Work and work Statistician. Screening  You may have the following tests or measurements: Height, weight, and BMI. Blood pressure. Lipid and cholesterol levels. These may be checked every 5 years, or more frequently if you are over 31 years old. Skin check. Lung cancer screening. You may have this screening every year starting at age 28 if you have a 30-pack-year history of smoking and currently smoke or have quit within the past 15 years. Fecal occult blood test (FOBT) of the stool. You may have this test every year starting at age 24. Flexible sigmoidoscopy or colonoscopy. You may have a sigmoidoscopy every 5 years or a colonoscopy every 10 years starting at age 68. Prostate cancer screening. Recommendations will vary depending on your family history and other risks. Hepatitis C blood test. Hepatitis B blood test. Sexually transmitted disease (STD) testing. Diabetes screening. This is done by checking your blood sugar (glucose) after you have not eaten for a while (fasting). You may have this done every 1-3 years. Abdominal aortic aneurysm (AAA) screening. You may need this if you are a current or former smoker. Osteoporosis. You may be screened starting at age 82 if you are at high risk. Talk with your health care provider about your test results, treatment options, and if necessary, the need for more tests. Vaccines  Your health care provider may recommend certain vaccines, such as: Influenza vaccine. This is recommended every year. Tetanus, diphtheria, and acellular pertussis (Tdap, Td) vaccine. You may need a Td booster every 10 years. Zoster vaccine. You may need this after age 63. Pneumococcal 13-valent conjugate (PCV13) vaccine. One dose is recommended after age 55. Pneumococcal polysaccharide (PPSV23) vaccine. One dose is recommended after age 72. Talk to your health care provider about  which screenings and vaccines you need and how often you need  them. This information is not intended to replace advice given to you by your health care provider. Make sure you discuss any questions you have with your health care provider. Document Released: 09/01/2015 Document Revised: 04/24/2016 Document Reviewed: 06/06/2015 Elsevier Interactive Patient Education  2017 [place] Prevention in the Home Falls can cause injuries. They can happen to people of all ages. There are many things you can do to make your home safe and to help prevent falls. What can I do on the outside of my home? Regularly fix the edges of walkways and driveways and fix any cracks. Remove anything that might make you trip as you walk through a door, such as a raised step or threshold. Trim any bushes or trees on the path to your home. Use bright outdoor lighting. Clear any walking paths of anything that might make someone trip, such as rocks or tools. Regularly check to see if handrails are loose or broken. Make sure that both sides of any steps have handrails. Any raised decks and porches should have guardrails on the edges. Have any leaves, snow, or ice cleared regularly. Use sand or salt on walking paths during winter. Clean up any spills in your garage right away. This includes oil or grease spills. What can I do in the bathroom? Use night lights. Install grab bars by the toilet and in the tub and shower. Do not use towel bars as grab bars. Use non-skid mats or decals in the tub or shower. If you need to sit down in the shower, use a plastic, non-slip stool. Keep the floor dry. Clean up any water that spills on the floor as soon as it happens. Remove soap buildup in the tub or shower regularly. Attach bath mats securely with double-sided non-slip rug tape. Do not have throw rugs and other things on the floor that can make you trip. What can I do in the bedroom? Use night lights. Make sure that you have a light by your bed that is easy to reach. Do not use  any sheets or blankets that are too big for your bed. They should not hang down onto the floor. Have a firm chair that has side arms. You can use this for support while you get dressed. Do not have throw rugs and other things on the floor that can make you trip. What can I do in the kitchen? Clean up any spills right away. Avoid walking on wet floors. Keep items that you use a lot in easy-to-reach places. If you need to reach something above you, use a strong step stool that has a grab bar. Keep electrical cords out of the way. Do not use floor polish or wax that makes floors slippery. If you must use wax, use non-skid floor wax. Do not have throw rugs and other things on the floor that can make you trip. What can I do with my stairs? Do not leave any items on the stairs. Make sure that there are handrails on both sides of the stairs and use them. Fix handrails that are broken or loose. Make sure that handrails are as long as the stairways. Check any carpeting to make sure that it is firmly attached to the stairs. Fix any carpet that is loose or worn. Avoid having throw rugs at the top or bottom of the stairs. If you do have throw rugs, attach them to the floor with  carpet tape. Make sure that you have a light switch at the top of the stairs and the bottom of the stairs. If you do not have them, ask someone to add them for you. What else can I do to help prevent falls? Wear shoes that: Do not have high heels. Have rubber bottoms. Are comfortable and fit you well. Are closed at the toe. Do not wear sandals. If you use a stepladder: Make sure that it is fully opened. Do not climb a closed stepladder. Make sure that both sides of the stepladder are locked into place. Ask someone to hold it for you, if possible. Clearly mark and make sure that you can see: Any grab bars or handrails. First and last steps. Where the edge of each step is. Use tools that help you move around (mobility aids)  if they are needed. These include: Canes. Walkers. Scooters. Crutches. Turn on the lights when you go into a dark area. Replace any light bulbs as soon as they burn out. Set up your furniture so you have a clear path. Avoid moving your furniture around. If any of your floors are uneven, fix them. If there are any pets around you, be aware of where they are. Review your medicines with your doctor. Some medicines can make you feel dizzy. This can increase your chance of falling. Ask your doctor what other things that you can do to help prevent falls. This information is not intended to replace advice given to you by your health care provider. Make sure you discuss any questions you have with your health care provider. Document Released: 06/01/2009 Document Revised: 01/11/2016 Document Reviewed: 09/09/2014 Elsevier Interactive Patient Education  2017 [employer].

## 2022-06-13 NOTE — Progress Notes (Signed)
Virtual Visit via Telephone Note  I connected with  Scott Macias on 06/13/22 at  8:45 AM EDT by telephone and verified that I am speaking with the correct person using two identifiers.  Location: Patient: home Provider: [facility] Persons participating in the virtual visit: patient/Nurse Health Advisor   I discussed the limitations, risks, security and privacy concerns of performing an evaluation and management service by telephone and the availability of in person appointments. The patient expressed understanding and agreed to proceed.  Interactive audio and video telecommunications were attempted between this nurse and patient, however failed, due to patient having technical difficulties OR patient did not have access to video capability.  We continued and completed visit with audio only.  Some vital signs may be absent or patient reported.   Scott David, LPN  Subjective:   Scott PREVETTE is a 66 y.o. male who presents for Medicare Annual/Subsequent preventive examination.  Review of Systems     Cardiac Risk Factors include: advanced age (>64mn, >>70women);dyslipidemia;male gender     Objective:    Today's Vitals   06/13/22 0901  BP: 123/85  Weight: 206 lb (93.4 kg)  Height: 6' (1.829 m)   Body mass index is 27.94 kg/m.     06/13/2022    8:59 AM 07/18/2016    3:46 PM  Advanced Directives  Does Patient Have a Medical Advance Directive? No Yes  Type of [place] will  Does patient want to make changes to medical advance directive?  No - Patient declined  [place]?  No - copy requested  Would patient like information on creating a medical advance directive? No - Patient declined No - Patient declined    Current Medications (verified) Outpatient Encounter Medications as of 06/13/2022  Medication Sig   acyclovir (ZOVIRAX) 400 MG tablet Take 400 mg by mouth 2 (two) times daily.    allopurinol (ZYLOPRIM) 300 MG tablet Take 300 mg by mouth daily.   aspirin 81 MG tablet Take 81 mg by mouth daily.   atorvastatin (LIPITOR) 20 MG tablet Take 20 mg by mouth daily.   brimonidine (ALPHAGAN) 0.2 % ophthalmic solution 3 (three) times daily.   calcitRIOL (ROCALTROL) 0.5 MCG capsule Take 0.5 mcg by mouth daily.   dorzolamide-timolol (COSOPT) 2-0.5 % ophthalmic solution 1 drop 2 (two) times daily.   gabapentin (NEURONTIN) 100 MG capsule Take 100 mg by mouth 2 (two) times daily.   Glucosamine-Chondroit-Vit C-Mn (GLUCOSAMINE 1500 COMPLEX) CAPS Take 1 capsule by mouth daily.   latanoprost (XALATAN) 0.005 % ophthalmic solution 1 drop at bedtime.   magnesium oxide (MAG-OX) 400 MG tablet Take 400 mg by mouth daily.    omeprazole (PRILOSEC) 20 MG capsule Take 20 mg by mouth daily.   predniSONE (DELTASONE) 5 MG tablet Take 5 mg by mouth daily.   metoprolol tartrate (LOPRESSOR) 25 MG tablet TAKE 1/2 TABLET (12.5 MG TOTAL) BY MOUTH 2 (TWO) TIMES DAILY. NEW MEDICATION   mycophenolate (CELLCEPT) 250 MG capsule Take by mouth 2 (two) times daily. (Patient not taking: Reported on 04/12/2022)   tacrolimus (PROGRAF) 0.5 MG capsule Take 0.5 mg by mouth at bedtime.   No facility-administered encounter medications on file as of 06/13/2022.    Allergies (verified) Amoxicillin-pot clavulanate   History: Past Medical History:  Diagnosis Date   GERD (gastroesophageal reflux disease)    Gout    Hyperlipidemia    Hypertension    Hypomagnesemia  Keratosis    on the face   Low bone density for age    Polycystic kidney disease    Renal insufficiency    Umbilical hernia    Past Surgical History:  Procedure Laterality Date   AV FISTULA PLACEMENT     BOWEL RESECTION     KIDNEY TRANSPLANT     NEPHRECTOMY TRANSPLANTED ORGAN     Family History  Problem Relation Age of Onset   CAD Father        died from complications of bypass surgery   Kidney disease Sister        renal transplant    Parkinson's disease Brother    Social History   Socioeconomic History   Marital status: Married    Spouse name: Not on file   Number of children: Not on file   Years of education: Not on file   Highest education level: Not on file  Occupational History   Not on file  Tobacco Use   Smoking status: Never   Smokeless tobacco: Never  Substance and Sexual Activity   Alcohol use: No    Alcohol/week: 0.0 standard drinks of alcohol   Drug use: No   Sexual activity: Not on file  Other Topics Concern   Not on file  Social History Narrative   Not on file   Social Determinants of Health   Financial Resource Strain: Low Risk  (06/13/2022)   Overall Financial Resource Strain (CARDIA)    Difficulty of Paying Living Expenses: Not hard at all  Food Insecurity: No Food Insecurity (06/13/2022)   Hunger Vital Sign    Worried About Running Out of Food in the Last Year: Never true    [place] in the Last Year: Never true  Transportation Needs: No Transportation Needs (06/13/2022)   PRAPARE - Hydrologist (Medical): No    Lack of Transportation (Non-Medical): No  Physical Activity: Insufficiently Active (06/13/2022)   Exercise Vital Sign    Days of Exercise per Week: 3 days    Minutes of Exercise per Session: 30 min  Stress: No Stress Concern Present (06/13/2022)   [place]    Feeling of Stress : Not at all  Social Connections: Moderately Integrated (06/13/2022)   Social Connection and Isolation Panel [NHANES]    Frequency of Communication with Friends and Family: More than three times a week    Frequency of Social Gatherings with Friends and Family: Once a week    Attends Religious Services: More than 4 times per year    Active Member of [employer] or Organizations: No    Attends Music therapist: Never    Marital Status: Married    Tobacco Counseling Counseling given: Not  Answered   Clinical Intake:  Pre-visit preparation completed: Yes  Pain : No/denies pain     Nutritional Risks: None Diabetes: No  How often do you need to have someone help you when you read instructions, pamphlets, or other written materials from your doctor or pharmacy?: 1 - Never  Diabetic?no  Interpreter Needed?: No  Information entered by :: Kirke Shaggy, LPN   Activities of Daily Living    06/13/2022    9:00 AM 06/09/2022   11:47 AM  In your present state of health, do you have any difficulty performing the following activities:  Hearing? 0 0  Vision? 0 0  Difficulty concentrating or making decisions? 0 0  Walking  or climbing stairs? 1 1  Dressing or bathing? 0 0  Doing errands, shopping? 0 0  Preparing Food and eating ? N N  Using the Toilet? N N  In the past six months, have you accidently leaked urine? N N  Do you have problems with loss of bowel control? N N  Managing your Medications? N N  Managing your Finances? N N  Housekeeping or managing your Housekeeping? N N    Patient Care Team: Coral Spikes, DO as PCP - General (Family Medicine) Harl Bowie Alphonse Guild, MD as PCP - Cardiology (Cardiology)  Indicate any recent Medical Services you may have received from other than [facility] providers in the past year (date may be approximate).     Assessment:   This is a routine wellness examination for [place].  Hearing/Vision screen Hearing Screening - Comments:: No aids  Vision Screening - Comments:: Wears glasses- DrSingh  Dietary issues and exercise activities discussed: Current Exercise Habits: Home exercise routine, Type of exercise: walking, Time (Minutes): 30, Frequency (Times/Week): 3, Weekly Exercise (Minutes/Week): 90, Intensity: Mild   Goals Addressed             This Visit's Progress    DIET - EAT MORE FRUITS AND VEGETABLES         Depression Screen    06/13/2022    8:57 AM 04/12/2022    3:22 PM 10/18/2020    9:26 AM 06/10/2017     8:53 AM  PHQ 2/9 Scores  PHQ - 2 Score 0 0 0 0  PHQ- 9 Score 0       Fall Risk    06/13/2022    8:59 AM 06/09/2022   11:47 AM 04/12/2022    3:23 PM 06/10/2017    8:53 AM  Fall Risk   Falls in the past year? 0 0 0 No  Number falls in past yr: 0     Injury with Fall? 0 0    Risk for fall due to : No Fall Risks  Other (Comment)   Follow up Falls prevention discussed;Falls evaluation completed  Falls evaluation completed     FALL RISK PREVENTION PERTAINING TO THE HOME:  Any stairs in or around the home? Yes  If so, are there any without handrails? No  Home free of loose throw rugs in walkways, pet beds, electrical cords, etc? Yes  Adequate lighting in your home to reduce risk of falls? Yes   ASSISTIVE DEVICES UTILIZED TO PREVENT FALLS:  Life alert? No  Use of a cane, walker or w/c? No  Grab bars in the bathroom? No  Shower chair or bench in shower? No  Elevated toilet seat or a handicapped toilet? No   Cognitive Function:        06/13/2022    9:01 AM  6CIT Screen  What Year? 0 points  What month? 0 points  What time? 0 points  Count back from 20 0 points  Months in reverse 0 points  Repeat phrase 0 points  Total Score 0 points    Immunizations Immunization History  Administered Date(s) Administered   Influenza,inj,Quad PF,6+ Mos 05/12/2017   PFIZER Comirnaty(Gray Top)Covid-19 Tri-Sucrose Vaccine 11/29/2019    TDAP status: Due, Education has been provided regarding the importance of this vaccine. Advised may receive this vaccine at local pharmacy or Health Dept. Aware to provide a copy of the vaccination record if obtained from local pharmacy or Health Dept. Verbalized acceptance and understanding.  Flu Vaccine status: Up to date  Pneumococcal vaccine status: Declined,  Education has been provided regarding the importance of this vaccine but patient still declined. Advised may receive this vaccine at local pharmacy or Health Dept. Aware to provide a copy of the  vaccination record if obtained from local pharmacy or Health Dept. Verbalized acceptance and understanding.   Covid-19 vaccine status: Completed vaccines  Qualifies for Shingles Vaccine? Yes   Zostavax completed No   Shingrix Completed?: No.    Education has been provided regarding the importance of this vaccine. Patient has been advised to call insurance company to determine out of pocket expense if they have not yet received this vaccine. Advised may also receive vaccine at local pharmacy or Health Dept. Verbalized acceptance and understanding.  Screening Tests Health Maintenance  Topic Date Due   Pneumonia Vaccine 49+ Years old (1 - PCV) Never done   TETANUS/TDAP  Never done   Zoster Vaccines- Shingrix (1 of 2) Never done   COVID-19 Vaccine (2 - Pfizer risk series) 12/20/2019   Medicare Annual Wellness (AWV)  07/14/2023   COLONOSCOPY (Pts 45-51yr Insurance coverage will need to be confirmed)  07/23/2027   INFLUENZA VACCINE  Completed   Hepatitis C Screening  Completed   HPV VACCINES  Aged Out    Health Maintenance  Health Maintenance Due  Topic Date Due   Pneumonia Vaccine 66 Years old (1 - PCV) Never done   TETANUS/TDAP  Never done   Zoster Vaccines- Shingrix (1 of 2) Never done   COVID-19 Vaccine (2 - Pfizer risk series) 12/20/2019    Colorectal cancer screening: Type of screening: Colonoscopy. Completed 07/22/17. [facility] every 10 years[facility]  Lung Cancer Screening: (Low Dose CT Chest recommended if Age 657-80years, 30 pack-year currently smoking OR have quit w/in 15years.) does not qualify.    Additional Screening:  Hepatitis C Screening: does qualify; Completed 01/03/21  Vision Screening: Recommended annual ophthalmology exams for early detection of glaucoma and other disorders of the eye. Is the patient up to date with their annual eye exam?  Yes  Who is the provider or what is the name of the office in which the patient attends annual eye exams?  Dr.Singh If pt is not established with a provider, would they like to be referred to a provider to establish care? No .   Dental Screening: Recommended annual dental exams for proper oral hygiene  Community Resource Referral / Chronic Care Management: CRR required this visit?  No   CCM required this visit?  No      Plan:     I have personally reviewed and noted the following in the patient's chart:   Medical and social history Use of alcohol, tobacco or illicit drugs  Current medications and supplements including opioid prescriptions. Patient is not currently taking opioid prescriptions. Functional ability and status Nutritional status Physical activity Advanced directives List of other physicians Hospitalizations, surgeries, and ER visits in previous 12 months Vitals Screenings to include cognitive, depression, and falls Referrals and appointments  In addition, I have reviewed and discussed with patient certain preventive protocols, quality metrics, and best practice recommendations. A written personalized care plan for preventive services as well as general preventive health recommendations were provided to patient.     LDionisio David LPN   189/21/1941  Nurse Notes: none

## 2022-07-25 ENCOUNTER — Telehealth: Payer: Self-pay

## 2022-07-25 NOTE — Telephone Encounter (Signed)
FYI Call from [facility] has discussed health goals with patient, was in the hospital Nov 29 thru Dec 1st has spoken with nephrology and urology -  Scott Macias [phone] if you have any questions or concerns.

## 2022-11-06 ENCOUNTER — Telehealth: Payer: Self-pay

## 2022-11-06 NOTE — Telephone Encounter (Signed)
Alexa Dean RN ([facility]) Care manager called and  wanted Dr Lacinda Axon to know patient is doing well and no longer needs their services. She is closing the patient's case out.

## 2023-06-27 ENCOUNTER — Ambulatory Visit (INDEPENDENT_AMBULATORY_CARE_PROVIDER_SITE_OTHER): Payer: Medicare PPO

## 2023-06-27 DIAGNOSIS — Z0000 Encounter for general adult medical examination without abnormal findings: Secondary | ICD-10-CM | POA: Diagnosis not present

## 2023-06-27 NOTE — Progress Notes (Signed)
Subjective:   Scott Macias is a 67 y.o. male who presents for Medicare Annual/Subsequent preventive examination.  Visit Complete: Virtual I connected with  Margaretha Sheffield on 06/27/23 by a audio enabled telemedicine application and verified that I am speaking with the correct person using two identifiers.  Patient Location: Home  Provider Location: Office/Clinic  I discussed the limitations of evaluation and management by telemedicine. The patient expressed understanding and agreed to proceed.  Vital Signs: Because this visit was a virtual/telehealth visit, some criteria may be missing or patient reported. Any vitals not documented were not able to be obtained and vitals that have been documented are patient reported.  Patient Medicare AWV questionnaire was completed by the patient on 06/26/2023; I have confirmed that all information answered by patient is correct and no changes since this date.  Cardiac Risk Factors include: advanced age (>16men, >28 women);dyslipidemia;hypertension;male gender     Objective:    Today's Vitals   There is no height or weight on file to calculate BMI.     06/27/2023    3:50 PM 06/13/2022    8:59 AM 07/18/2016    3:46 PM  Advanced Directives  Does Patient Have a Medical Advance Directive? Yes No Yes  Type of Estate agent of [place];Living will  Healthcare Power of [place];Living will  Does patient want to make changes to medical advance directive?   No - Patient declined  Copy of Healthcare Power of Attorney in Chart? No - copy requested  No - copy requested  Would patient like information on creating a medical advance directive?  No - Patient declined No - Patient declined    Current Medications (verified) Outpatient Encounter Medications as of 06/27/2023  Medication Sig   acyclovir (ZOVIRAX) 400 MG tablet Take 400 mg by mouth 2 (two) times daily.   allopurinol (ZYLOPRIM) 300 MG tablet Take 300 mg by mouth daily.    atorvastatin (LIPITOR) 20 MG tablet Take 20 mg by mouth daily.   brimonidine (ALPHAGAN) 0.2 % ophthalmic solution 3 (three) times daily.   calcitRIOL (ROCALTROL) 0.5 MCG capsule Take 0.5 mcg by mouth daily.   dorzolamide-timolol (COSOPT) 2-0.5 % ophthalmic solution 1 drop 2 (two) times daily.   gabapentin (NEURONTIN) 100 MG capsule Take 100 mg by mouth 2 (two) times daily.   Glucosamine-Chondroit-Vit C-Mn (GLUCOSAMINE 1500 COMPLEX) CAPS Take 1 capsule by mouth daily.   latanoprost (XALATAN) 0.005 % ophthalmic solution 1 drop at bedtime.   magnesium oxide (MAG-OX) 400 MG tablet Take 400 mg by mouth daily.    omeprazole (PRILOSEC) 20 MG capsule Take 20 mg by mouth daily.   predniSONE (DELTASONE) 5 MG tablet Take 5 mg by mouth daily.   tacrolimus (PROGRAF) 0.5 MG capsule Take 1 mg by mouth 2 (two) times daily. 12 hours apart   aspirin 81 MG tablet Take 81 mg by mouth daily.   metoprolol tartrate (LOPRESSOR) 25 MG tablet TAKE 1/2 TABLET (12.5 MG TOTAL) BY MOUTH 2 (TWO) TIMES DAILY. NEW MEDICATION   mycophenolate (CELLCEPT) 250 MG capsule Take by mouth 2 (two) times daily. (Patient not taking: Reported on 04/12/2022)   No facility-administered encounter medications on file as of 06/27/2023.    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   Gout    Heart murmur    Hocm   Hyperlipidemia    Hypertension    Hypomagnesemia    Keratosis    on the face   Low bone density for age    Neuromuscular disorder (HCC)    Neuropathy   Polycystic kidney disease    Renal insufficiency    Umbilical hernia    Past Surgical History:  Procedure Laterality Date    AV FISTULA PLACEMENT     BOWEL RESECTION     CHOLECYSTECTOMY  03/28/2022   Removed at [facility]   EYE SURGERY     Cataract surgery June& July 2019 Dr.Odum   KIDNEY TRANSPLANT     NEPHRECTOMY TRANSPLANTED ORGAN     SMALL INTESTINE SURGERY     Emergency surgery 08-2014 [place]   Family History  Problem Relation Age of Onset   CAD Father        died from complications of bypass surgery   Kidney disease Sister        renal transplant   Parkinson's disease Brother    Social History   Socioeconomic History   Marital status: Married    Spouse name: Not on file   Number of children: Not on file   Years of education: Not on file   Highest education level: Not on file  Occupational History   Not on file  Tobacco Use   Smoking status: Never   Smokeless tobacco: Never  Vaping Use   Vaping status: Never Used  Substance and Sexual Activity   Alcohol use: No   Drug use: No   Sexual activity: Yes    Birth control/protection: None  Other Topics Concern   Not on file  Social History Narrative   Not on file   Social Determinants of Health   Financial Resource Strain: Low Risk  (06/26/2023)   Overall Financial Resource Strain (CARDIA)    Difficulty of Paying Living Expenses: Not very hard  Food Insecurity: No Food Insecurity (06/26/2023)   Hunger Vital Sign    Worried About Running Out of Food in the Last Year: Never true    Ran Out of Food in the Last Year: Never true  Transportation Needs: No Transportation Needs (06/26/2023)   PRAPARE - Administrator, Civil Service (Medical): No    Lack of Transportation (Non-Medical): No  Physical Activity: Inactive (06/26/2023)   Exercise Vital Sign    Days of Exercise per Week: 0 days    Minutes of Exercise per Session: 0 min  Stress: No Stress Concern Present (06/26/2023)   [employer] of Occupational Health - Occupational Stress Questionnaire    Feeling of Stress : Not at all  Social Connections: Unknown  (06/26/2023)   Social Connection and Isolation Panel [NHANES]    Frequency of Communication with Friends and Family: More than three times a week    Frequency of Social Gatherings with Friends and Family: Three times a week    Attends Religious Services: Not on file    Active Member of Clubs or Organizations: Yes    Attends Banker Meetings: More than 4 times per year    Marital Status: Married    Tobacco Counseling Counseling given: Not Answered   Clinical Intake:  Pre-visit preparation completed: Yes  Pain : No/denies pain     Nutritional Risks: None  Diabetes: No  How often do you need to have someone help you when you read instructions, pamphlets, or other written materials from your doctor or pharmacy?: 1 - Never  Interpreter Needed?: No  Information entered by :: NAllen LPN   Activities of Daily Living    06/26/2023    9:47 AM  In your present state of health, do you have any difficulty performing the following activities:  Hearing? 0  Vision? 0  Difficulty concentrating or making decisions? 0  Walking or climbing stairs? 1  Comment wears braces  Dressing or bathing? 0  Doing errands, shopping? 0  Preparing Food and eating ? N  Using the Toilet? N  In the past six months, have you accidently leaked urine? N  Do you have problems with loss of bowel control? N  Managing your Medications? N  Managing your Finances? N  Housekeeping or managing your Housekeeping? N    Patient Care Team: Tommie Sams, DO as PCP - General (Family Medicine) Wyline Mood Dorothe Pea, MD as PCP - Cardiology (Cardiology)  Indicate any recent Medical Services you may have received from other than [facility] providers in the past year (date may be approximate).     Assessment:   This is a routine wellness examination for [place].  Hearing/Vision screen Hearing Screening - Comments:: Denies hearing issues Vision Screening - Comments:: Regular eye exams, [facility]   Goals Addressed              This Visit's Progress    Patient Stated       06/27/2023, stay alive       Depression Screen    06/27/2023    3:51 PM 06/13/2022    8:57 AM 04/12/2022    3:22 PM 10/18/2020    9:26 AM 06/10/2017    8:53 AM  PHQ 2/9 Scores  PHQ - 2 Score 0 0 0 0 0  PHQ- 9 Score 0 0       Fall Risk    06/26/2023    9:47 AM 06/13/2022    8:59 AM 06/09/2022   11:47 AM 04/12/2022    3:23 PM 06/10/2017    8:53 AM  Fall Risk   Falls in the past year? 0 0 0 0 No  Number falls in past yr: 0 0     Injury with Fall? 0 0 0    Risk for fall due to : Medication side effect;Impaired mobility;Impaired balance/gait No Fall Risks  Other (Comment)   Follow up Falls prevention discussed;Falls evaluation completed Falls prevention discussed;Falls evaluation completed  Falls evaluation completed     MEDICARE RISK AT HOME: Medicare Risk at Home Any stairs in or around the home?: Yes If so, are there any without handrails?: No Home free of loose throw rugs in walkways, pet beds, electrical cords, etc?: Yes Adequate lighting in your home to reduce risk of falls?: Yes Life alert?: No Use of a cane, walker or w/c?: No Grab bars in the bathroom?: No Shower chair or bench in shower?: No Elevated toilet seat or a handicapped toilet?: Yes  TIMED UP AND GO:  Was the test performed?  No    Cognitive Function:        06/27/2023    3:52 PM 06/13/2022    9:01 AM  6CIT Screen  What Year? 0 points 0 points  What month? 0 points 0 points  What time? 0 points 0 points  Count back from 20 0 points 0 points  Months  in reverse 0 points 0 points  Repeat phrase 0 points 0 points  Total Score 0 points 0 points    Immunizations Immunization History  Administered Date(s) Administered   Influenza,inj,Quad PF,6+ Mos 05/12/2017   PFIZER Comirnaty(Gray Top)Covid-19 Tri-Sucrose Vaccine 11/29/2019    TDAP status: Up to date  Flu Vaccine status: Up to date  Pneumococcal vaccine status: Declined,   Education has been provided regarding the importance of this vaccine but patient still declined. Advised may receive this vaccine at local pharmacy or Health Dept. Aware to provide a copy of the vaccination record if obtained from local pharmacy or Health Dept. Verbalized acceptance and understanding.   Covid-19 vaccine status: Information provided on how to obtain vaccines.   Qualifies for Shingles Vaccine? Yes   Zostavax completed No   Shingrix Completed?: No.    Education has been provided regarding the importance of this vaccine. Patient has been advised to call insurance company to determine out of pocket expense if they have not yet received this vaccine. Advised may also receive vaccine at local pharmacy or Health Dept. Verbalized acceptance and understanding.  Screening Tests Health Maintenance  Topic Date Due   DTaP/Tdap/Td (1 - Tdap) Never done   COVID-19 Vaccine (2 - Pfizer risk series) 07/13/2023 (Originally 12/20/2019)   Zoster Vaccines- Shingrix (1 of 2) 09/27/2023 (Originally [birth year]1975)   Pneumonia Vaccine 53+ Years old (1 of 2 - PCV) 06/26/2024 (Originally [birth year]1962)   Medicare Annual Wellness (AWV)  06/26/2024   Colonoscopy  12/19/2030   INFLUENZA VACCINE  Completed   Hepatitis C Screening  Completed   HPV VACCINES  Aged Out    Health Maintenance  Health Maintenance Due  Topic Date Due   DTaP/Tdap/Td (1 - Tdap) Never done    Colorectal cancer screening: Type of screening: Colonoscopy. Completed 12/18/2020. Repeat every 5 years  Lung Cancer Screening: (Low Dose CT Chest recommended if Age 19-80 years, 20 pack-year currently smoking OR have quit w/in 15years.) does not qualify.   Lung Cancer Screening Referral: no  Additional Screening:  Hepatitis C Screening: does qualify; Completed 01/03/2021  Vision Screening: Recommended annual ophthalmology exams for early detection of glaucoma and other disorders of the eye. Is the patient up to date with their annual eye exam?   Yes  Who is the provider or what is the name of the office in which the patient attends annual eye exams? [facility] If pt is not established with a provider, would they like to be referred to a provider to establish care? No .   Dental Screening: Recommended annual dental exams for proper oral hygiene  Diabetic Foot Exam: n/a  Community Resource Referral / Chronic Care Management: CRR required this visit?  No   CCM required this visit?  No     Plan:     I have personally reviewed and noted the following in the patient's chart:   Medical and social history Use of alcohol, tobacco or illicit drugs  Current medications and supplements including opioid prescriptions. Patient is not currently taking opioid prescriptions. Functional ability and status Nutritional status Physical activity Advanced directives List of other physicians Hospitalizations, surgeries, and ER visits in previous 12 months Vitals Screenings to include cognitive, depression, and falls Referrals and appointments  In addition, I have reviewed and discussed with patient certain preventive protocols, quality metrics, and best practice recommendations. A written personalized care plan for preventive services as well as general preventive health recommendations were provided to patient.     Herbie Saxon  Clabe Seal, LPN   [id number]/04/[id number]   After Visit Summary: (MyChart) Due to this being a telephonic visit, the after visit summary with patients personalized plan was offered to patient via MyChart   Nurse Notes: none

## 2023-06-27 NOTE — Patient Instructions (Signed)
Scott Macias , Thank you for taking time to come for your Medicare Wellness Visit. I appreciate your ongoing commitment to your health goals. Please review the following plan we discussed and let me know if I can assist you in the future.   Referrals/Orders/Follow-Ups/Clinician Recommendations: none  This is a list of the screening recommended for you and due dates:  Health Maintenance  Topic Date Due   DTaP/Tdap/Td vaccine (1 - Tdap) Never done   COVID-19 Vaccine (2 - Pfizer risk series) 07/13/2023*   Zoster (Shingles) Vaccine (1 of 2) 09/27/2023*   Pneumonia Vaccine (1 of 2 - PCV) 06/26/2024*   Medicare Annual Wellness Visit  06/26/2024   Colon Cancer Screening  12/19/2030   Flu Shot  Completed   Hepatitis C Screening  Completed   HPV Vaccine  Aged Out  *Topic was postponed. The date shown is not the original due date.    Advanced directives: (Copy Requested) Please bring a copy of your health care power of attorney and living will to the office to be added to your chart at your convenience.  Next Medicare Annual Wellness Visit scheduled for next year: No, did not schedule at this time  insert Preventive Care attachment Insert FALL PREVENTION attachment if needed

## 2023-07-29 ENCOUNTER — Encounter: Payer: Self-pay | Admitting: Nephrology

## 2023-08-22 DIAGNOSIS — R5383 Other fatigue: Secondary | ICD-10-CM | POA: Diagnosis not present

## 2023-08-22 DIAGNOSIS — D47Z1 Post-transplant lymphoproliferative disorder (PTLD): Secondary | ICD-10-CM | POA: Diagnosis not present

## 2023-08-22 DIAGNOSIS — T8619 Other complication of kidney transplant: Secondary | ICD-10-CM | POA: Diagnosis not present

## 2023-08-28 DIAGNOSIS — H401133 Primary open-angle glaucoma, bilateral, severe stage: Secondary | ICD-10-CM | POA: Diagnosis not present

## 2023-10-27 DIAGNOSIS — M25562 Pain in left knee: Secondary | ICD-10-CM | POA: Diagnosis not present

## 2023-10-27 DIAGNOSIS — Z940 Kidney transplant status: Secondary | ICD-10-CM | POA: Diagnosis not present

## 2023-10-27 DIAGNOSIS — N200 Calculus of kidney: Secondary | ICD-10-CM | POA: Diagnosis not present

## 2023-10-27 DIAGNOSIS — I10 Essential (primary) hypertension: Secondary | ICD-10-CM | POA: Diagnosis not present

## 2023-10-27 DIAGNOSIS — L989 Disorder of the skin and subcutaneous tissue, unspecified: Secondary | ICD-10-CM | POA: Diagnosis not present

## 2023-10-27 DIAGNOSIS — M25561 Pain in right knee: Secondary | ICD-10-CM | POA: Diagnosis not present

## 2023-10-27 DIAGNOSIS — N1832 Chronic kidney disease, stage 3b: Secondary | ICD-10-CM | POA: Diagnosis not present

## 2023-10-27 DIAGNOSIS — N2581 Secondary hyperparathyroidism of renal origin: Secondary | ICD-10-CM | POA: Diagnosis not present

## 2023-10-27 DIAGNOSIS — G8929 Other chronic pain: Secondary | ICD-10-CM | POA: Diagnosis not present

## 2023-11-05 DIAGNOSIS — M25561 Pain in right knee: Secondary | ICD-10-CM | POA: Diagnosis not present

## 2023-11-05 DIAGNOSIS — M6281 Muscle weakness (generalized): Secondary | ICD-10-CM | POA: Diagnosis not present

## 2023-11-05 DIAGNOSIS — M25562 Pain in left knee: Secondary | ICD-10-CM | POA: Diagnosis not present

## 2023-11-05 DIAGNOSIS — M222X2 Patellofemoral disorders, left knee: Secondary | ICD-10-CM | POA: Diagnosis not present

## 2023-12-22 DIAGNOSIS — H401233 Low-tension glaucoma, bilateral, severe stage: Secondary | ICD-10-CM | POA: Diagnosis not present

## 2024-01-01 DIAGNOSIS — D0462 Carcinoma in situ of skin of left upper limb, including shoulder: Secondary | ICD-10-CM | POA: Diagnosis not present

## 2024-01-01 DIAGNOSIS — C4442 Squamous cell carcinoma of skin of scalp and neck: Secondary | ICD-10-CM | POA: Diagnosis not present

## 2024-01-01 DIAGNOSIS — L2089 Other atopic dermatitis: Secondary | ICD-10-CM | POA: Diagnosis not present

## 2024-01-01 DIAGNOSIS — D492 Neoplasm of unspecified behavior of bone, soft tissue, and skin: Secondary | ICD-10-CM | POA: Diagnosis not present

## 2024-01-01 DIAGNOSIS — D485 Neoplasm of uncertain behavior of skin: Secondary | ICD-10-CM | POA: Diagnosis not present

## 2024-01-01 DIAGNOSIS — D849 Immunodeficiency, unspecified: Secondary | ICD-10-CM | POA: Diagnosis not present

## 2024-01-01 DIAGNOSIS — L570 Actinic keratosis: Secondary | ICD-10-CM | POA: Diagnosis not present

## 2024-02-09 DIAGNOSIS — C4442 Squamous cell carcinoma of skin of scalp and neck: Secondary | ICD-10-CM | POA: Diagnosis not present

## 2024-02-14 DIAGNOSIS — R29818 Other symptoms and signs involving the nervous system: Secondary | ICD-10-CM | POA: Diagnosis not present

## 2024-02-14 DIAGNOSIS — N189 Chronic kidney disease, unspecified: Secondary | ICD-10-CM | POA: Diagnosis not present

## 2024-02-14 DIAGNOSIS — R918 Other nonspecific abnormal finding of lung field: Secondary | ICD-10-CM | POA: Diagnosis not present

## 2024-02-14 DIAGNOSIS — J9690 Respiratory failure, unspecified, unspecified whether with hypoxia or hypercapnia: Secondary | ICD-10-CM | POA: Diagnosis not present

## 2024-02-14 DIAGNOSIS — J9601 Acute respiratory failure with hypoxia: Secondary | ICD-10-CM | POA: Diagnosis not present

## 2024-02-14 DIAGNOSIS — N472 Paraphimosis: Secondary | ICD-10-CM | POA: Diagnosis not present

## 2024-02-14 DIAGNOSIS — R001 Bradycardia, unspecified: Secondary | ICD-10-CM | POA: Diagnosis not present

## 2024-02-14 DIAGNOSIS — Z950 Presence of cardiac pacemaker: Secondary | ICD-10-CM | POA: Diagnosis not present

## 2024-02-14 DIAGNOSIS — I4819 Other persistent atrial fibrillation: Secondary | ICD-10-CM | POA: Diagnosis not present

## 2024-02-14 DIAGNOSIS — Z5181 Encounter for therapeutic drug level monitoring: Secondary | ICD-10-CM | POA: Diagnosis not present

## 2024-02-14 DIAGNOSIS — D849 Immunodeficiency, unspecified: Secondary | ICD-10-CM | POA: Diagnosis not present

## 2024-02-14 DIAGNOSIS — I517 Cardiomegaly: Secondary | ICD-10-CM | POA: Diagnosis not present

## 2024-02-14 DIAGNOSIS — I38 Endocarditis, valve unspecified: Secondary | ICD-10-CM | POA: Diagnosis not present

## 2024-02-14 DIAGNOSIS — J90 Pleural effusion, not elsewhere classified: Secondary | ICD-10-CM | POA: Diagnosis not present

## 2024-02-14 DIAGNOSIS — R931 Abnormal findings on diagnostic imaging of heart and coronary circulation: Secondary | ICD-10-CM | POA: Diagnosis not present

## 2024-02-14 DIAGNOSIS — N1832 Chronic kidney disease, stage 3b: Secondary | ICD-10-CM | POA: Diagnosis not present

## 2024-02-14 DIAGNOSIS — D84821 Immunodeficiency due to drugs: Secondary | ICD-10-CM | POA: Diagnosis not present

## 2024-02-14 DIAGNOSIS — I469 Cardiac arrest, cause unspecified: Secondary | ICD-10-CM | POA: Diagnosis not present

## 2024-02-14 DIAGNOSIS — I421 Obstructive hypertrophic cardiomyopathy: Secondary | ICD-10-CM | POA: Diagnosis not present

## 2024-02-14 DIAGNOSIS — J9819 Other pulmonary collapse: Secondary | ICD-10-CM | POA: Diagnosis not present

## 2024-02-14 DIAGNOSIS — M7989 Other specified soft tissue disorders: Secondary | ICD-10-CM | POA: Diagnosis not present

## 2024-02-14 DIAGNOSIS — N170 Acute kidney failure with tubular necrosis: Secondary | ICD-10-CM | POA: Diagnosis not present

## 2024-02-14 DIAGNOSIS — Z01818 Encounter for other preprocedural examination: Secondary | ICD-10-CM | POA: Diagnosis not present

## 2024-02-14 DIAGNOSIS — E46 Unspecified protein-calorie malnutrition: Secondary | ICD-10-CM | POA: Diagnosis not present

## 2024-02-14 DIAGNOSIS — I340 Nonrheumatic mitral (valve) insufficiency: Secondary | ICD-10-CM | POA: Diagnosis not present

## 2024-02-14 DIAGNOSIS — Z4682 Encounter for fitting and adjustment of non-vascular catheter: Secondary | ICD-10-CM | POA: Diagnosis not present

## 2024-02-14 DIAGNOSIS — I130 Hypertensive heart and chronic kidney disease with heart failure and stage 1 through stage 4 chronic kidney disease, or unspecified chronic kidney disease: Secondary | ICD-10-CM | POA: Diagnosis not present

## 2024-02-14 DIAGNOSIS — Z9221 Personal history of antineoplastic chemotherapy: Secondary | ICD-10-CM | POA: Diagnosis not present

## 2024-02-14 DIAGNOSIS — I330 Acute and subacute infective endocarditis: Secondary | ICD-10-CM | POA: Diagnosis not present

## 2024-02-14 DIAGNOSIS — D47Z1 Post-transplant lymphoproliferative disorder (PTLD): Secondary | ICD-10-CM | POA: Diagnosis not present

## 2024-02-14 DIAGNOSIS — I5082 Biventricular heart failure: Secondary | ICD-10-CM | POA: Diagnosis not present

## 2024-02-14 DIAGNOSIS — Z954 Presence of other heart-valve replacement: Secondary | ICD-10-CM | POA: Diagnosis not present

## 2024-02-14 DIAGNOSIS — Z4659 Encounter for fitting and adjustment of other gastrointestinal appliance and device: Secondary | ICD-10-CM | POA: Diagnosis not present

## 2024-02-14 DIAGNOSIS — D649 Anemia, unspecified: Secondary | ICD-10-CM | POA: Diagnosis not present

## 2024-02-14 DIAGNOSIS — J9602 Acute respiratory failure with hypercapnia: Secondary | ICD-10-CM | POA: Diagnosis not present

## 2024-02-14 DIAGNOSIS — R570 Cardiogenic shock: Secondary | ICD-10-CM | POA: Diagnosis not present

## 2024-02-14 DIAGNOSIS — I120 Hypertensive chronic kidney disease with stage 5 chronic kidney disease or end stage renal disease: Secondary | ICD-10-CM | POA: Diagnosis not present

## 2024-02-14 DIAGNOSIS — I442 Atrioventricular block, complete: Secondary | ICD-10-CM | POA: Diagnosis not present

## 2024-02-14 DIAGNOSIS — J948 Other specified pleural conditions: Secondary | ICD-10-CM | POA: Diagnosis not present

## 2024-02-14 DIAGNOSIS — R579 Shock, unspecified: Secondary | ICD-10-CM | POA: Diagnosis not present

## 2024-02-14 DIAGNOSIS — R4182 Altered mental status, unspecified: Secondary | ICD-10-CM | POA: Diagnosis not present

## 2024-02-14 DIAGNOSIS — J939 Pneumothorax, unspecified: Secondary | ICD-10-CM | POA: Diagnosis not present

## 2024-02-14 DIAGNOSIS — J9811 Atelectasis: Secondary | ICD-10-CM | POA: Diagnosis not present

## 2024-02-14 DIAGNOSIS — I4891 Unspecified atrial fibrillation: Secondary | ICD-10-CM | POA: Diagnosis not present

## 2024-02-14 DIAGNOSIS — Z942 Lung transplant status: Secondary | ICD-10-CM | POA: Diagnosis not present

## 2024-02-14 DIAGNOSIS — Z01810 Encounter for preprocedural cardiovascular examination: Secondary | ICD-10-CM | POA: Diagnosis not present

## 2024-02-14 DIAGNOSIS — A4159 Other Gram-negative sepsis: Secondary | ICD-10-CM | POA: Diagnosis not present

## 2024-02-14 DIAGNOSIS — J811 Chronic pulmonary edema: Secondary | ICD-10-CM | POA: Diagnosis not present

## 2024-02-14 DIAGNOSIS — M6281 Muscle weakness (generalized): Secondary | ICD-10-CM | POA: Diagnosis not present

## 2024-02-14 DIAGNOSIS — Z8679 Personal history of other diseases of the circulatory system: Secondary | ICD-10-CM | POA: Diagnosis not present

## 2024-02-14 DIAGNOSIS — E875 Hyperkalemia: Secondary | ICD-10-CM | POA: Diagnosis not present

## 2024-02-14 DIAGNOSIS — J951 Acute pulmonary insufficiency following thoracic surgery: Secondary | ICD-10-CM | POA: Diagnosis not present

## 2024-02-14 DIAGNOSIS — J9692 Respiratory failure, unspecified with hypercapnia: Secondary | ICD-10-CM | POA: Diagnosis not present

## 2024-02-14 DIAGNOSIS — G8918 Other acute postprocedural pain: Secondary | ICD-10-CM | POA: Diagnosis not present

## 2024-02-14 DIAGNOSIS — N179 Acute kidney failure, unspecified: Secondary | ICD-10-CM | POA: Diagnosis not present

## 2024-02-14 DIAGNOSIS — R0789 Other chest pain: Secondary | ICD-10-CM | POA: Diagnosis not present

## 2024-02-14 DIAGNOSIS — D62 Acute posthemorrhagic anemia: Secondary | ICD-10-CM | POA: Diagnosis not present

## 2024-02-14 DIAGNOSIS — B370 Candidal stomatitis: Secondary | ICD-10-CM | POA: Diagnosis not present

## 2024-02-14 DIAGNOSIS — R262 Difficulty in walking, not elsewhere classified: Secondary | ICD-10-CM | POA: Diagnosis not present

## 2024-02-14 DIAGNOSIS — N186 End stage renal disease: Secondary | ICD-10-CM | POA: Diagnosis not present

## 2024-02-14 DIAGNOSIS — Z940 Kidney transplant status: Secondary | ICD-10-CM | POA: Diagnosis not present

## 2024-02-14 DIAGNOSIS — Z952 Presence of prosthetic heart valve: Secondary | ICD-10-CM | POA: Diagnosis not present

## 2024-02-14 DIAGNOSIS — Z452 Encounter for adjustment and management of vascular access device: Secondary | ICD-10-CM | POA: Diagnosis not present

## 2024-02-14 DIAGNOSIS — I491 Atrial premature depolarization: Secondary | ICD-10-CM | POA: Diagnosis not present

## 2024-02-14 DIAGNOSIS — Z9889 Other specified postprocedural states: Secondary | ICD-10-CM | POA: Diagnosis not present

## 2024-02-14 DIAGNOSIS — I959 Hypotension, unspecified: Secondary | ICD-10-CM | POA: Diagnosis not present

## 2024-02-14 DIAGNOSIS — D72829 Elevated white blood cell count, unspecified: Secondary | ICD-10-CM | POA: Diagnosis not present

## 2024-02-14 DIAGNOSIS — D631 Anemia in chronic kidney disease: Secondary | ICD-10-CM | POA: Diagnosis not present

## 2024-02-14 DIAGNOSIS — R0602 Shortness of breath: Secondary | ICD-10-CM | POA: Diagnosis not present

## 2024-02-14 DIAGNOSIS — R1312 Dysphagia, oropharyngeal phase: Secondary | ICD-10-CM | POA: Diagnosis not present

## 2024-02-14 DIAGNOSIS — T8619 Other complication of kidney transplant: Secondary | ICD-10-CM | POA: Diagnosis not present

## 2024-02-14 DIAGNOSIS — Z85828 Personal history of other malignant neoplasm of skin: Secondary | ICD-10-CM | POA: Diagnosis not present

## 2024-02-14 DIAGNOSIS — Z79899 Other long term (current) drug therapy: Secondary | ICD-10-CM | POA: Diagnosis not present

## 2024-02-14 DIAGNOSIS — Z79621 Long term (current) use of calcineurin inhibitor: Secondary | ICD-10-CM | POA: Diagnosis not present

## 2024-02-14 DIAGNOSIS — R7881 Bacteremia: Secondary | ICD-10-CM | POA: Diagnosis not present

## 2024-02-14 DIAGNOSIS — Q249 Congenital malformation of heart, unspecified: Secondary | ICD-10-CM | POA: Diagnosis not present

## 2024-02-15 DIAGNOSIS — I340 Nonrheumatic mitral (valve) insufficiency: Secondary | ICD-10-CM | POA: Diagnosis not present

## 2024-02-15 DIAGNOSIS — I421 Obstructive hypertrophic cardiomyopathy: Secondary | ICD-10-CM | POA: Diagnosis not present

## 2024-02-15 DIAGNOSIS — Z8679 Personal history of other diseases of the circulatory system: Secondary | ICD-10-CM | POA: Diagnosis not present

## 2024-02-15 DIAGNOSIS — D72829 Elevated white blood cell count, unspecified: Secondary | ICD-10-CM | POA: Diagnosis not present

## 2024-02-15 DIAGNOSIS — R918 Other nonspecific abnormal finding of lung field: Secondary | ICD-10-CM | POA: Diagnosis not present

## 2024-02-15 DIAGNOSIS — J9601 Acute respiratory failure with hypoxia: Secondary | ICD-10-CM | POA: Diagnosis not present

## 2024-02-15 DIAGNOSIS — R570 Cardiogenic shock: Secondary | ICD-10-CM | POA: Diagnosis not present

## 2024-02-15 DIAGNOSIS — N179 Acute kidney failure, unspecified: Secondary | ICD-10-CM | POA: Diagnosis not present

## 2024-02-15 DIAGNOSIS — Z940 Kidney transplant status: Secondary | ICD-10-CM | POA: Diagnosis not present

## 2024-02-15 DIAGNOSIS — Z01818 Encounter for other preprocedural examination: Secondary | ICD-10-CM | POA: Diagnosis not present

## 2024-02-15 DIAGNOSIS — J90 Pleural effusion, not elsewhere classified: Secondary | ICD-10-CM | POA: Diagnosis not present

## 2024-02-15 DIAGNOSIS — Z9889 Other specified postprocedural states: Secondary | ICD-10-CM | POA: Diagnosis not present

## 2024-02-15 DIAGNOSIS — Z85828 Personal history of other malignant neoplasm of skin: Secondary | ICD-10-CM | POA: Diagnosis not present

## 2024-02-15 DIAGNOSIS — R0602 Shortness of breath: Secondary | ICD-10-CM | POA: Diagnosis not present

## 2024-02-15 DIAGNOSIS — Z942 Lung transplant status: Secondary | ICD-10-CM | POA: Diagnosis not present

## 2024-02-16 DIAGNOSIS — Z942 Lung transplant status: Secondary | ICD-10-CM | POA: Diagnosis not present

## 2024-02-16 DIAGNOSIS — I421 Obstructive hypertrophic cardiomyopathy: Secondary | ICD-10-CM | POA: Diagnosis not present

## 2024-02-16 DIAGNOSIS — J9601 Acute respiratory failure with hypoxia: Secondary | ICD-10-CM | POA: Diagnosis not present

## 2024-02-16 DIAGNOSIS — D849 Immunodeficiency, unspecified: Secondary | ICD-10-CM | POA: Diagnosis not present

## 2024-02-16 DIAGNOSIS — R570 Cardiogenic shock: Secondary | ICD-10-CM | POA: Diagnosis not present

## 2024-02-16 DIAGNOSIS — R918 Other nonspecific abnormal finding of lung field: Secondary | ICD-10-CM | POA: Diagnosis not present

## 2024-02-16 DIAGNOSIS — R0602 Shortness of breath: Secondary | ICD-10-CM | POA: Diagnosis not present

## 2024-02-16 DIAGNOSIS — J90 Pleural effusion, not elsewhere classified: Secondary | ICD-10-CM | POA: Diagnosis not present

## 2024-02-16 DIAGNOSIS — N179 Acute kidney failure, unspecified: Secondary | ICD-10-CM | POA: Diagnosis not present

## 2024-02-16 DIAGNOSIS — I340 Nonrheumatic mitral (valve) insufficiency: Secondary | ICD-10-CM | POA: Diagnosis not present

## 2024-02-16 DIAGNOSIS — D72829 Elevated white blood cell count, unspecified: Secondary | ICD-10-CM | POA: Diagnosis not present

## 2024-02-16 DIAGNOSIS — Z01818 Encounter for other preprocedural examination: Secondary | ICD-10-CM | POA: Diagnosis not present

## 2024-02-16 DIAGNOSIS — Z940 Kidney transplant status: Secondary | ICD-10-CM | POA: Diagnosis not present

## 2024-02-17 DIAGNOSIS — I421 Obstructive hypertrophic cardiomyopathy: Secondary | ICD-10-CM | POA: Diagnosis not present

## 2024-02-17 DIAGNOSIS — I340 Nonrheumatic mitral (valve) insufficiency: Secondary | ICD-10-CM | POA: Diagnosis not present

## 2024-02-17 DIAGNOSIS — J9811 Atelectasis: Secondary | ICD-10-CM | POA: Diagnosis not present

## 2024-02-17 DIAGNOSIS — R570 Cardiogenic shock: Secondary | ICD-10-CM | POA: Diagnosis not present

## 2024-02-17 DIAGNOSIS — D72829 Elevated white blood cell count, unspecified: Secondary | ICD-10-CM | POA: Diagnosis not present

## 2024-02-17 DIAGNOSIS — J9601 Acute respiratory failure with hypoxia: Secondary | ICD-10-CM | POA: Diagnosis not present

## 2024-02-17 DIAGNOSIS — N179 Acute kidney failure, unspecified: Secondary | ICD-10-CM | POA: Diagnosis not present

## 2024-02-17 DIAGNOSIS — Z452 Encounter for adjustment and management of vascular access device: Secondary | ICD-10-CM | POA: Diagnosis not present

## 2024-02-17 DIAGNOSIS — J90 Pleural effusion, not elsewhere classified: Secondary | ICD-10-CM | POA: Diagnosis not present

## 2024-02-17 DIAGNOSIS — Z940 Kidney transplant status: Secondary | ICD-10-CM | POA: Diagnosis not present

## 2024-02-17 DIAGNOSIS — Z01810 Encounter for preprocedural cardiovascular examination: Secondary | ICD-10-CM | POA: Diagnosis not present

## 2024-02-17 DIAGNOSIS — R918 Other nonspecific abnormal finding of lung field: Secondary | ICD-10-CM | POA: Diagnosis not present

## 2024-02-17 DIAGNOSIS — D849 Immunodeficiency, unspecified: Secondary | ICD-10-CM | POA: Diagnosis not present

## 2024-02-17 DIAGNOSIS — D649 Anemia, unspecified: Secondary | ICD-10-CM | POA: Diagnosis not present

## 2024-02-17 DIAGNOSIS — I120 Hypertensive chronic kidney disease with stage 5 chronic kidney disease or end stage renal disease: Secondary | ICD-10-CM | POA: Diagnosis not present

## 2024-02-17 DIAGNOSIS — N1832 Chronic kidney disease, stage 3b: Secondary | ICD-10-CM | POA: Diagnosis not present

## 2024-02-17 DIAGNOSIS — R0602 Shortness of breath: Secondary | ICD-10-CM | POA: Diagnosis not present

## 2024-02-18 DIAGNOSIS — J90 Pleural effusion, not elsewhere classified: Secondary | ICD-10-CM | POA: Diagnosis not present

## 2024-02-18 DIAGNOSIS — J9811 Atelectasis: Secondary | ICD-10-CM | POA: Diagnosis not present

## 2024-02-18 DIAGNOSIS — D72829 Elevated white blood cell count, unspecified: Secondary | ICD-10-CM | POA: Diagnosis not present

## 2024-02-18 DIAGNOSIS — D649 Anemia, unspecified: Secondary | ICD-10-CM | POA: Diagnosis not present

## 2024-02-18 DIAGNOSIS — R918 Other nonspecific abnormal finding of lung field: Secondary | ICD-10-CM | POA: Diagnosis not present

## 2024-02-18 DIAGNOSIS — Z4682 Encounter for fitting and adjustment of non-vascular catheter: Secondary | ICD-10-CM | POA: Diagnosis not present

## 2024-02-18 DIAGNOSIS — R570 Cardiogenic shock: Secondary | ICD-10-CM | POA: Diagnosis not present

## 2024-02-18 DIAGNOSIS — Z452 Encounter for adjustment and management of vascular access device: Secondary | ICD-10-CM | POA: Diagnosis not present

## 2024-02-18 DIAGNOSIS — N179 Acute kidney failure, unspecified: Secondary | ICD-10-CM | POA: Diagnosis not present

## 2024-02-18 DIAGNOSIS — I421 Obstructive hypertrophic cardiomyopathy: Secondary | ICD-10-CM | POA: Diagnosis not present

## 2024-02-18 DIAGNOSIS — D849 Immunodeficiency, unspecified: Secondary | ICD-10-CM | POA: Diagnosis not present

## 2024-02-18 DIAGNOSIS — Z940 Kidney transplant status: Secondary | ICD-10-CM | POA: Diagnosis not present

## 2024-02-18 DIAGNOSIS — Z4659 Encounter for fitting and adjustment of other gastrointestinal appliance and device: Secondary | ICD-10-CM | POA: Diagnosis not present

## 2024-02-18 DIAGNOSIS — I340 Nonrheumatic mitral (valve) insufficiency: Secondary | ICD-10-CM | POA: Diagnosis not present

## 2024-02-18 DIAGNOSIS — I120 Hypertensive chronic kidney disease with stage 5 chronic kidney disease or end stage renal disease: Secondary | ICD-10-CM | POA: Diagnosis not present

## 2024-02-18 DIAGNOSIS — J9601 Acute respiratory failure with hypoxia: Secondary | ICD-10-CM | POA: Diagnosis not present

## 2024-02-18 DIAGNOSIS — N1832 Chronic kidney disease, stage 3b: Secondary | ICD-10-CM | POA: Diagnosis not present

## 2024-02-19 DIAGNOSIS — I421 Obstructive hypertrophic cardiomyopathy: Secondary | ICD-10-CM | POA: Diagnosis not present

## 2024-02-19 DIAGNOSIS — I340 Nonrheumatic mitral (valve) insufficiency: Secondary | ICD-10-CM | POA: Diagnosis not present

## 2024-02-19 DIAGNOSIS — R0602 Shortness of breath: Secondary | ICD-10-CM | POA: Diagnosis not present

## 2024-02-19 DIAGNOSIS — Z4682 Encounter for fitting and adjustment of non-vascular catheter: Secondary | ICD-10-CM | POA: Diagnosis not present

## 2024-02-19 DIAGNOSIS — A4159 Other Gram-negative sepsis: Secondary | ICD-10-CM | POA: Diagnosis not present

## 2024-02-19 DIAGNOSIS — D72829 Elevated white blood cell count, unspecified: Secondary | ICD-10-CM | POA: Diagnosis not present

## 2024-02-19 DIAGNOSIS — Z8679 Personal history of other diseases of the circulatory system: Secondary | ICD-10-CM | POA: Diagnosis not present

## 2024-02-19 DIAGNOSIS — Z942 Lung transplant status: Secondary | ICD-10-CM | POA: Diagnosis not present

## 2024-02-19 DIAGNOSIS — J9811 Atelectasis: Secondary | ICD-10-CM | POA: Diagnosis not present

## 2024-02-19 DIAGNOSIS — N179 Acute kidney failure, unspecified: Secondary | ICD-10-CM | POA: Diagnosis not present

## 2024-02-19 DIAGNOSIS — D47Z1 Post-transplant lymphoproliferative disorder (PTLD): Secondary | ICD-10-CM | POA: Diagnosis not present

## 2024-02-19 DIAGNOSIS — Z79899 Other long term (current) drug therapy: Secondary | ICD-10-CM | POA: Diagnosis not present

## 2024-02-19 DIAGNOSIS — Z940 Kidney transplant status: Secondary | ICD-10-CM | POA: Diagnosis not present

## 2024-02-19 DIAGNOSIS — J90 Pleural effusion, not elsewhere classified: Secondary | ICD-10-CM | POA: Diagnosis not present

## 2024-02-19 DIAGNOSIS — J9601 Acute respiratory failure with hypoxia: Secondary | ICD-10-CM | POA: Diagnosis not present

## 2024-02-19 DIAGNOSIS — Z452 Encounter for adjustment and management of vascular access device: Secondary | ICD-10-CM | POA: Diagnosis not present

## 2024-02-19 DIAGNOSIS — Z954 Presence of other heart-valve replacement: Secondary | ICD-10-CM | POA: Diagnosis not present

## 2024-02-19 DIAGNOSIS — J811 Chronic pulmonary edema: Secondary | ICD-10-CM | POA: Diagnosis not present

## 2024-02-20 DIAGNOSIS — Z4659 Encounter for fitting and adjustment of other gastrointestinal appliance and device: Secondary | ICD-10-CM | POA: Diagnosis not present

## 2024-02-20 DIAGNOSIS — A4159 Other Gram-negative sepsis: Secondary | ICD-10-CM | POA: Diagnosis not present

## 2024-02-20 DIAGNOSIS — D849 Immunodeficiency, unspecified: Secondary | ICD-10-CM | POA: Diagnosis not present

## 2024-02-20 DIAGNOSIS — D47Z1 Post-transplant lymphoproliferative disorder (PTLD): Secondary | ICD-10-CM | POA: Diagnosis not present

## 2024-02-20 DIAGNOSIS — J90 Pleural effusion, not elsewhere classified: Secondary | ICD-10-CM | POA: Diagnosis not present

## 2024-02-20 DIAGNOSIS — D62 Acute posthemorrhagic anemia: Secondary | ICD-10-CM | POA: Diagnosis not present

## 2024-02-20 DIAGNOSIS — R0602 Shortness of breath: Secondary | ICD-10-CM | POA: Diagnosis not present

## 2024-02-20 DIAGNOSIS — Z952 Presence of prosthetic heart valve: Secondary | ICD-10-CM | POA: Diagnosis not present

## 2024-02-20 DIAGNOSIS — Z8679 Personal history of other diseases of the circulatory system: Secondary | ICD-10-CM | POA: Diagnosis not present

## 2024-02-20 DIAGNOSIS — R7881 Bacteremia: Secondary | ICD-10-CM | POA: Diagnosis not present

## 2024-02-20 DIAGNOSIS — J9811 Atelectasis: Secondary | ICD-10-CM | POA: Diagnosis not present

## 2024-02-20 DIAGNOSIS — Z954 Presence of other heart-valve replacement: Secondary | ICD-10-CM | POA: Diagnosis not present

## 2024-02-20 DIAGNOSIS — J939 Pneumothorax, unspecified: Secondary | ICD-10-CM | POA: Diagnosis not present

## 2024-02-20 DIAGNOSIS — J951 Acute pulmonary insufficiency following thoracic surgery: Secondary | ICD-10-CM | POA: Diagnosis not present

## 2024-02-20 DIAGNOSIS — Z452 Encounter for adjustment and management of vascular access device: Secondary | ICD-10-CM | POA: Diagnosis not present

## 2024-02-20 DIAGNOSIS — Z940 Kidney transplant status: Secondary | ICD-10-CM | POA: Diagnosis not present

## 2024-02-20 DIAGNOSIS — R579 Shock, unspecified: Secondary | ICD-10-CM | POA: Diagnosis not present

## 2024-02-20 DIAGNOSIS — Z4682 Encounter for fitting and adjustment of non-vascular catheter: Secondary | ICD-10-CM | POA: Diagnosis not present

## 2024-02-20 DIAGNOSIS — N186 End stage renal disease: Secondary | ICD-10-CM | POA: Diagnosis not present

## 2024-02-20 DIAGNOSIS — I340 Nonrheumatic mitral (valve) insufficiency: Secondary | ICD-10-CM | POA: Diagnosis not present

## 2024-02-20 DIAGNOSIS — Z79899 Other long term (current) drug therapy: Secondary | ICD-10-CM | POA: Diagnosis not present

## 2024-02-20 DIAGNOSIS — I421 Obstructive hypertrophic cardiomyopathy: Secondary | ICD-10-CM | POA: Diagnosis not present

## 2024-02-20 DIAGNOSIS — G8918 Other acute postprocedural pain: Secondary | ICD-10-CM | POA: Diagnosis not present

## 2024-02-21 DIAGNOSIS — D62 Acute posthemorrhagic anemia: Secondary | ICD-10-CM | POA: Diagnosis not present

## 2024-02-21 DIAGNOSIS — N186 End stage renal disease: Secondary | ICD-10-CM | POA: Diagnosis not present

## 2024-02-21 DIAGNOSIS — J90 Pleural effusion, not elsewhere classified: Secondary | ICD-10-CM | POA: Diagnosis not present

## 2024-02-21 DIAGNOSIS — Z940 Kidney transplant status: Secondary | ICD-10-CM | POA: Diagnosis not present

## 2024-02-21 DIAGNOSIS — R7881 Bacteremia: Secondary | ICD-10-CM | POA: Diagnosis not present

## 2024-02-21 DIAGNOSIS — Z452 Encounter for adjustment and management of vascular access device: Secondary | ICD-10-CM | POA: Diagnosis not present

## 2024-02-21 DIAGNOSIS — R579 Shock, unspecified: Secondary | ICD-10-CM | POA: Diagnosis not present

## 2024-02-21 DIAGNOSIS — G8918 Other acute postprocedural pain: Secondary | ICD-10-CM | POA: Diagnosis not present

## 2024-02-21 DIAGNOSIS — J951 Acute pulmonary insufficiency following thoracic surgery: Secondary | ICD-10-CM | POA: Diagnosis not present

## 2024-02-21 DIAGNOSIS — I421 Obstructive hypertrophic cardiomyopathy: Secondary | ICD-10-CM | POA: Diagnosis not present

## 2024-02-21 DIAGNOSIS — Z952 Presence of prosthetic heart valve: Secondary | ICD-10-CM | POA: Diagnosis not present

## 2024-02-21 DIAGNOSIS — J9811 Atelectasis: Secondary | ICD-10-CM | POA: Diagnosis not present

## 2024-02-21 DIAGNOSIS — D849 Immunodeficiency, unspecified: Secondary | ICD-10-CM | POA: Diagnosis not present

## 2024-02-21 DIAGNOSIS — Z4682 Encounter for fitting and adjustment of non-vascular catheter: Secondary | ICD-10-CM | POA: Diagnosis not present

## 2024-02-21 DIAGNOSIS — J939 Pneumothorax, unspecified: Secondary | ICD-10-CM | POA: Diagnosis not present

## 2024-02-22 DIAGNOSIS — I421 Obstructive hypertrophic cardiomyopathy: Secondary | ICD-10-CM | POA: Diagnosis not present

## 2024-02-22 DIAGNOSIS — N186 End stage renal disease: Secondary | ICD-10-CM | POA: Diagnosis not present

## 2024-02-22 DIAGNOSIS — D849 Immunodeficiency, unspecified: Secondary | ICD-10-CM | POA: Diagnosis not present

## 2024-02-22 DIAGNOSIS — J9811 Atelectasis: Secondary | ICD-10-CM | POA: Diagnosis not present

## 2024-02-22 DIAGNOSIS — J939 Pneumothorax, unspecified: Secondary | ICD-10-CM | POA: Diagnosis not present

## 2024-02-22 DIAGNOSIS — J90 Pleural effusion, not elsewhere classified: Secondary | ICD-10-CM | POA: Diagnosis not present

## 2024-02-22 DIAGNOSIS — Z452 Encounter for adjustment and management of vascular access device: Secondary | ICD-10-CM | POA: Diagnosis not present

## 2024-02-22 DIAGNOSIS — Z940 Kidney transplant status: Secondary | ICD-10-CM | POA: Diagnosis not present

## 2024-02-22 DIAGNOSIS — Z4682 Encounter for fitting and adjustment of non-vascular catheter: Secondary | ICD-10-CM | POA: Diagnosis not present

## 2024-02-22 DIAGNOSIS — J951 Acute pulmonary insufficiency following thoracic surgery: Secondary | ICD-10-CM | POA: Diagnosis not present

## 2024-02-22 DIAGNOSIS — R579 Shock, unspecified: Secondary | ICD-10-CM | POA: Diagnosis not present

## 2024-02-22 DIAGNOSIS — D62 Acute posthemorrhagic anemia: Secondary | ICD-10-CM | POA: Diagnosis not present

## 2024-02-22 DIAGNOSIS — G8918 Other acute postprocedural pain: Secondary | ICD-10-CM | POA: Diagnosis not present

## 2024-02-22 DIAGNOSIS — Z952 Presence of prosthetic heart valve: Secondary | ICD-10-CM | POA: Diagnosis not present

## 2024-02-22 DIAGNOSIS — R7881 Bacteremia: Secondary | ICD-10-CM | POA: Diagnosis not present

## 2024-02-23 DIAGNOSIS — I959 Hypotension, unspecified: Secondary | ICD-10-CM | POA: Diagnosis not present

## 2024-02-23 DIAGNOSIS — Z940 Kidney transplant status: Secondary | ICD-10-CM | POA: Diagnosis not present

## 2024-02-23 DIAGNOSIS — I491 Atrial premature depolarization: Secondary | ICD-10-CM | POA: Diagnosis not present

## 2024-02-23 DIAGNOSIS — I38 Endocarditis, valve unspecified: Secondary | ICD-10-CM | POA: Diagnosis not present

## 2024-02-23 DIAGNOSIS — N1832 Chronic kidney disease, stage 3b: Secondary | ICD-10-CM | POA: Diagnosis not present

## 2024-02-23 DIAGNOSIS — J9811 Atelectasis: Secondary | ICD-10-CM | POA: Diagnosis not present

## 2024-02-23 DIAGNOSIS — J811 Chronic pulmonary edema: Secondary | ICD-10-CM | POA: Diagnosis not present

## 2024-02-23 DIAGNOSIS — I421 Obstructive hypertrophic cardiomyopathy: Secondary | ICD-10-CM | POA: Diagnosis not present

## 2024-02-23 DIAGNOSIS — D849 Immunodeficiency, unspecified: Secondary | ICD-10-CM | POA: Diagnosis not present

## 2024-02-23 DIAGNOSIS — Z8679 Personal history of other diseases of the circulatory system: Secondary | ICD-10-CM | POA: Diagnosis not present

## 2024-02-23 DIAGNOSIS — N179 Acute kidney failure, unspecified: Secondary | ICD-10-CM | POA: Diagnosis not present

## 2024-02-23 DIAGNOSIS — D649 Anemia, unspecified: Secondary | ICD-10-CM | POA: Diagnosis not present

## 2024-02-23 DIAGNOSIS — A4159 Other Gram-negative sepsis: Secondary | ICD-10-CM | POA: Diagnosis not present

## 2024-02-23 DIAGNOSIS — R1312 Dysphagia, oropharyngeal phase: Secondary | ICD-10-CM | POA: Diagnosis not present

## 2024-02-23 DIAGNOSIS — Z952 Presence of prosthetic heart valve: Secondary | ICD-10-CM | POA: Diagnosis not present

## 2024-02-23 DIAGNOSIS — R918 Other nonspecific abnormal finding of lung field: Secondary | ICD-10-CM | POA: Diagnosis not present

## 2024-02-23 DIAGNOSIS — I130 Hypertensive heart and chronic kidney disease with heart failure and stage 1 through stage 4 chronic kidney disease, or unspecified chronic kidney disease: Secondary | ICD-10-CM | POA: Diagnosis not present

## 2024-02-23 DIAGNOSIS — I442 Atrioventricular block, complete: Secondary | ICD-10-CM | POA: Diagnosis not present

## 2024-02-23 DIAGNOSIS — Q249 Congenital malformation of heart, unspecified: Secondary | ICD-10-CM | POA: Diagnosis not present

## 2024-02-23 DIAGNOSIS — I5082 Biventricular heart failure: Secondary | ICD-10-CM | POA: Diagnosis not present

## 2024-02-23 DIAGNOSIS — Z79899 Other long term (current) drug therapy: Secondary | ICD-10-CM | POA: Diagnosis not present

## 2024-02-23 DIAGNOSIS — Z954 Presence of other heart-valve replacement: Secondary | ICD-10-CM | POA: Diagnosis not present

## 2024-02-23 DIAGNOSIS — I340 Nonrheumatic mitral (valve) insufficiency: Secondary | ICD-10-CM | POA: Diagnosis not present

## 2024-02-23 DIAGNOSIS — R0602 Shortness of breath: Secondary | ICD-10-CM | POA: Diagnosis not present

## 2024-02-23 DIAGNOSIS — D47Z1 Post-transplant lymphoproliferative disorder (PTLD): Secondary | ICD-10-CM | POA: Diagnosis not present

## 2024-02-23 DIAGNOSIS — J90 Pleural effusion, not elsewhere classified: Secondary | ICD-10-CM | POA: Diagnosis not present

## 2024-02-24 DIAGNOSIS — I421 Obstructive hypertrophic cardiomyopathy: Secondary | ICD-10-CM | POA: Diagnosis not present

## 2024-02-24 DIAGNOSIS — I5082 Biventricular heart failure: Secondary | ICD-10-CM | POA: Diagnosis not present

## 2024-02-24 DIAGNOSIS — Z8679 Personal history of other diseases of the circulatory system: Secondary | ICD-10-CM | POA: Diagnosis not present

## 2024-02-24 DIAGNOSIS — Z952 Presence of prosthetic heart valve: Secondary | ICD-10-CM | POA: Diagnosis not present

## 2024-02-24 DIAGNOSIS — Z940 Kidney transplant status: Secondary | ICD-10-CM | POA: Diagnosis not present

## 2024-02-24 DIAGNOSIS — J939 Pneumothorax, unspecified: Secondary | ICD-10-CM | POA: Diagnosis not present

## 2024-02-24 DIAGNOSIS — I130 Hypertensive heart and chronic kidney disease with heart failure and stage 1 through stage 4 chronic kidney disease, or unspecified chronic kidney disease: Secondary | ICD-10-CM | POA: Diagnosis not present

## 2024-02-24 DIAGNOSIS — R1312 Dysphagia, oropharyngeal phase: Secondary | ICD-10-CM | POA: Diagnosis not present

## 2024-02-24 DIAGNOSIS — A4159 Other Gram-negative sepsis: Secondary | ICD-10-CM | POA: Diagnosis not present

## 2024-02-24 DIAGNOSIS — N179 Acute kidney failure, unspecified: Secondary | ICD-10-CM | POA: Diagnosis not present

## 2024-02-24 DIAGNOSIS — Z79899 Other long term (current) drug therapy: Secondary | ICD-10-CM | POA: Diagnosis not present

## 2024-02-24 DIAGNOSIS — I38 Endocarditis, valve unspecified: Secondary | ICD-10-CM | POA: Diagnosis not present

## 2024-02-24 DIAGNOSIS — R918 Other nonspecific abnormal finding of lung field: Secondary | ICD-10-CM | POA: Diagnosis not present

## 2024-02-24 DIAGNOSIS — R931 Abnormal findings on diagnostic imaging of heart and coronary circulation: Secondary | ICD-10-CM | POA: Diagnosis not present

## 2024-02-24 DIAGNOSIS — D47Z1 Post-transplant lymphoproliferative disorder (PTLD): Secondary | ICD-10-CM | POA: Diagnosis not present

## 2024-02-24 DIAGNOSIS — J90 Pleural effusion, not elsewhere classified: Secondary | ICD-10-CM | POA: Diagnosis not present

## 2024-02-24 DIAGNOSIS — J9811 Atelectasis: Secondary | ICD-10-CM | POA: Diagnosis not present

## 2024-02-24 DIAGNOSIS — I959 Hypotension, unspecified: Secondary | ICD-10-CM | POA: Diagnosis not present

## 2024-02-24 DIAGNOSIS — N1832 Chronic kidney disease, stage 3b: Secondary | ICD-10-CM | POA: Diagnosis not present

## 2024-02-24 DIAGNOSIS — D649 Anemia, unspecified: Secondary | ICD-10-CM | POA: Diagnosis not present

## 2024-02-24 DIAGNOSIS — I340 Nonrheumatic mitral (valve) insufficiency: Secondary | ICD-10-CM | POA: Diagnosis not present

## 2024-02-24 DIAGNOSIS — I442 Atrioventricular block, complete: Secondary | ICD-10-CM | POA: Diagnosis not present

## 2024-02-24 DIAGNOSIS — Z954 Presence of other heart-valve replacement: Secondary | ICD-10-CM | POA: Diagnosis not present

## 2024-02-24 DIAGNOSIS — R0602 Shortness of breath: Secondary | ICD-10-CM | POA: Diagnosis not present

## 2024-02-25 DIAGNOSIS — I340 Nonrheumatic mitral (valve) insufficiency: Secondary | ICD-10-CM | POA: Diagnosis not present

## 2024-02-25 DIAGNOSIS — Z952 Presence of prosthetic heart valve: Secondary | ICD-10-CM | POA: Diagnosis not present

## 2024-02-25 DIAGNOSIS — Z954 Presence of other heart-valve replacement: Secondary | ICD-10-CM | POA: Diagnosis not present

## 2024-02-25 DIAGNOSIS — D649 Anemia, unspecified: Secondary | ICD-10-CM | POA: Diagnosis not present

## 2024-02-25 DIAGNOSIS — I130 Hypertensive heart and chronic kidney disease with heart failure and stage 1 through stage 4 chronic kidney disease, or unspecified chronic kidney disease: Secondary | ICD-10-CM | POA: Diagnosis not present

## 2024-02-25 DIAGNOSIS — D47Z1 Post-transplant lymphoproliferative disorder (PTLD): Secondary | ICD-10-CM | POA: Diagnosis not present

## 2024-02-25 DIAGNOSIS — R918 Other nonspecific abnormal finding of lung field: Secondary | ICD-10-CM | POA: Diagnosis not present

## 2024-02-25 DIAGNOSIS — I442 Atrioventricular block, complete: Secondary | ICD-10-CM | POA: Diagnosis not present

## 2024-02-25 DIAGNOSIS — I959 Hypotension, unspecified: Secondary | ICD-10-CM | POA: Diagnosis not present

## 2024-02-25 DIAGNOSIS — N1832 Chronic kidney disease, stage 3b: Secondary | ICD-10-CM | POA: Diagnosis not present

## 2024-02-25 DIAGNOSIS — I5082 Biventricular heart failure: Secondary | ICD-10-CM | POA: Diagnosis not present

## 2024-02-25 DIAGNOSIS — J9811 Atelectasis: Secondary | ICD-10-CM | POA: Diagnosis not present

## 2024-02-25 DIAGNOSIS — Z940 Kidney transplant status: Secondary | ICD-10-CM | POA: Diagnosis not present

## 2024-02-25 DIAGNOSIS — R0602 Shortness of breath: Secondary | ICD-10-CM | POA: Diagnosis not present

## 2024-02-25 DIAGNOSIS — I421 Obstructive hypertrophic cardiomyopathy: Secondary | ICD-10-CM | POA: Diagnosis not present

## 2024-02-25 DIAGNOSIS — R931 Abnormal findings on diagnostic imaging of heart and coronary circulation: Secondary | ICD-10-CM | POA: Diagnosis not present

## 2024-02-25 DIAGNOSIS — Z79899 Other long term (current) drug therapy: Secondary | ICD-10-CM | POA: Diagnosis not present

## 2024-02-25 DIAGNOSIS — Z8679 Personal history of other diseases of the circulatory system: Secondary | ICD-10-CM | POA: Diagnosis not present

## 2024-02-25 DIAGNOSIS — N179 Acute kidney failure, unspecified: Secondary | ICD-10-CM | POA: Diagnosis not present

## 2024-02-25 DIAGNOSIS — A4159 Other Gram-negative sepsis: Secondary | ICD-10-CM | POA: Diagnosis not present

## 2024-02-25 DIAGNOSIS — J90 Pleural effusion, not elsewhere classified: Secondary | ICD-10-CM | POA: Diagnosis not present

## 2024-02-25 DIAGNOSIS — R001 Bradycardia, unspecified: Secondary | ICD-10-CM | POA: Diagnosis not present

## 2024-02-25 DIAGNOSIS — R1312 Dysphagia, oropharyngeal phase: Secondary | ICD-10-CM | POA: Diagnosis not present

## 2024-02-25 DIAGNOSIS — J939 Pneumothorax, unspecified: Secondary | ICD-10-CM | POA: Diagnosis not present

## 2024-02-25 DIAGNOSIS — I38 Endocarditis, valve unspecified: Secondary | ICD-10-CM | POA: Diagnosis not present

## 2024-02-26 DIAGNOSIS — D47Z1 Post-transplant lymphoproliferative disorder (PTLD): Secondary | ICD-10-CM | POA: Diagnosis not present

## 2024-02-26 DIAGNOSIS — A4159 Other Gram-negative sepsis: Secondary | ICD-10-CM | POA: Diagnosis not present

## 2024-02-26 DIAGNOSIS — I340 Nonrheumatic mitral (valve) insufficiency: Secondary | ICD-10-CM | POA: Diagnosis not present

## 2024-02-26 DIAGNOSIS — R931 Abnormal findings on diagnostic imaging of heart and coronary circulation: Secondary | ICD-10-CM | POA: Diagnosis not present

## 2024-02-26 DIAGNOSIS — I442 Atrioventricular block, complete: Secondary | ICD-10-CM | POA: Diagnosis not present

## 2024-02-26 DIAGNOSIS — R0602 Shortness of breath: Secondary | ICD-10-CM | POA: Diagnosis not present

## 2024-02-26 DIAGNOSIS — N179 Acute kidney failure, unspecified: Secondary | ICD-10-CM | POA: Diagnosis not present

## 2024-02-26 DIAGNOSIS — I959 Hypotension, unspecified: Secondary | ICD-10-CM | POA: Diagnosis not present

## 2024-02-26 DIAGNOSIS — N1832 Chronic kidney disease, stage 3b: Secondary | ICD-10-CM | POA: Diagnosis not present

## 2024-02-26 DIAGNOSIS — D849 Immunodeficiency, unspecified: Secondary | ICD-10-CM | POA: Diagnosis not present

## 2024-02-26 DIAGNOSIS — Z79899 Other long term (current) drug therapy: Secondary | ICD-10-CM | POA: Diagnosis not present

## 2024-02-26 DIAGNOSIS — J90 Pleural effusion, not elsewhere classified: Secondary | ICD-10-CM | POA: Diagnosis not present

## 2024-02-26 DIAGNOSIS — I38 Endocarditis, valve unspecified: Secondary | ICD-10-CM | POA: Diagnosis not present

## 2024-02-26 DIAGNOSIS — R1312 Dysphagia, oropharyngeal phase: Secondary | ICD-10-CM | POA: Diagnosis not present

## 2024-02-26 DIAGNOSIS — D649 Anemia, unspecified: Secondary | ICD-10-CM | POA: Diagnosis not present

## 2024-02-26 DIAGNOSIS — Z8679 Personal history of other diseases of the circulatory system: Secondary | ICD-10-CM | POA: Diagnosis not present

## 2024-02-26 DIAGNOSIS — Z954 Presence of other heart-valve replacement: Secondary | ICD-10-CM | POA: Diagnosis not present

## 2024-02-26 DIAGNOSIS — I421 Obstructive hypertrophic cardiomyopathy: Secondary | ICD-10-CM | POA: Diagnosis not present

## 2024-02-26 DIAGNOSIS — I130 Hypertensive heart and chronic kidney disease with heart failure and stage 1 through stage 4 chronic kidney disease, or unspecified chronic kidney disease: Secondary | ICD-10-CM | POA: Diagnosis not present

## 2024-02-26 DIAGNOSIS — J9811 Atelectasis: Secondary | ICD-10-CM | POA: Diagnosis not present

## 2024-02-26 DIAGNOSIS — Z952 Presence of prosthetic heart valve: Secondary | ICD-10-CM | POA: Diagnosis not present

## 2024-02-26 DIAGNOSIS — Z940 Kidney transplant status: Secondary | ICD-10-CM | POA: Diagnosis not present

## 2024-02-26 DIAGNOSIS — I5082 Biventricular heart failure: Secondary | ICD-10-CM | POA: Diagnosis not present

## 2024-02-26 DIAGNOSIS — J811 Chronic pulmonary edema: Secondary | ICD-10-CM | POA: Diagnosis not present

## 2024-02-27 DIAGNOSIS — I442 Atrioventricular block, complete: Secondary | ICD-10-CM | POA: Diagnosis not present

## 2024-02-27 DIAGNOSIS — N1832 Chronic kidney disease, stage 3b: Secondary | ICD-10-CM | POA: Diagnosis not present

## 2024-02-27 DIAGNOSIS — D849 Immunodeficiency, unspecified: Secondary | ICD-10-CM | POA: Diagnosis not present

## 2024-02-27 DIAGNOSIS — J90 Pleural effusion, not elsewhere classified: Secondary | ICD-10-CM | POA: Diagnosis not present

## 2024-02-27 DIAGNOSIS — Z940 Kidney transplant status: Secondary | ICD-10-CM | POA: Diagnosis not present

## 2024-02-27 DIAGNOSIS — R918 Other nonspecific abnormal finding of lung field: Secondary | ICD-10-CM | POA: Diagnosis not present

## 2024-02-27 DIAGNOSIS — D47Z1 Post-transplant lymphoproliferative disorder (PTLD): Secondary | ICD-10-CM | POA: Diagnosis not present

## 2024-02-27 DIAGNOSIS — Z8679 Personal history of other diseases of the circulatory system: Secondary | ICD-10-CM | POA: Diagnosis not present

## 2024-02-27 DIAGNOSIS — Z954 Presence of other heart-valve replacement: Secondary | ICD-10-CM | POA: Diagnosis not present

## 2024-02-27 DIAGNOSIS — I959 Hypotension, unspecified: Secondary | ICD-10-CM | POA: Diagnosis not present

## 2024-02-27 DIAGNOSIS — I421 Obstructive hypertrophic cardiomyopathy: Secondary | ICD-10-CM | POA: Diagnosis not present

## 2024-02-27 DIAGNOSIS — N179 Acute kidney failure, unspecified: Secondary | ICD-10-CM | POA: Diagnosis not present

## 2024-02-27 DIAGNOSIS — I38 Endocarditis, valve unspecified: Secondary | ICD-10-CM | POA: Diagnosis not present

## 2024-02-27 DIAGNOSIS — D649 Anemia, unspecified: Secondary | ICD-10-CM | POA: Diagnosis not present

## 2024-02-27 DIAGNOSIS — Z79899 Other long term (current) drug therapy: Secondary | ICD-10-CM | POA: Diagnosis not present

## 2024-02-27 DIAGNOSIS — R0602 Shortness of breath: Secondary | ICD-10-CM | POA: Diagnosis not present

## 2024-02-27 DIAGNOSIS — A4159 Other Gram-negative sepsis: Secondary | ICD-10-CM | POA: Diagnosis not present

## 2024-02-27 DIAGNOSIS — R1312 Dysphagia, oropharyngeal phase: Secondary | ICD-10-CM | POA: Diagnosis not present

## 2024-02-27 DIAGNOSIS — I130 Hypertensive heart and chronic kidney disease with heart failure and stage 1 through stage 4 chronic kidney disease, or unspecified chronic kidney disease: Secondary | ICD-10-CM | POA: Diagnosis not present

## 2024-02-27 DIAGNOSIS — Z952 Presence of prosthetic heart valve: Secondary | ICD-10-CM | POA: Diagnosis not present

## 2024-02-27 DIAGNOSIS — I5082 Biventricular heart failure: Secondary | ICD-10-CM | POA: Diagnosis not present

## 2024-02-27 DIAGNOSIS — I340 Nonrheumatic mitral (valve) insufficiency: Secondary | ICD-10-CM | POA: Diagnosis not present

## 2024-02-28 DIAGNOSIS — I421 Obstructive hypertrophic cardiomyopathy: Secondary | ICD-10-CM | POA: Diagnosis not present

## 2024-02-28 DIAGNOSIS — I959 Hypotension, unspecified: Secondary | ICD-10-CM | POA: Diagnosis not present

## 2024-02-28 DIAGNOSIS — N1832 Chronic kidney disease, stage 3b: Secondary | ICD-10-CM | POA: Diagnosis not present

## 2024-02-28 DIAGNOSIS — N179 Acute kidney failure, unspecified: Secondary | ICD-10-CM | POA: Diagnosis not present

## 2024-02-28 DIAGNOSIS — J9811 Atelectasis: Secondary | ICD-10-CM | POA: Diagnosis not present

## 2024-02-28 DIAGNOSIS — J90 Pleural effusion, not elsewhere classified: Secondary | ICD-10-CM | POA: Diagnosis not present

## 2024-02-28 DIAGNOSIS — Z952 Presence of prosthetic heart valve: Secondary | ICD-10-CM | POA: Diagnosis not present

## 2024-02-28 DIAGNOSIS — I38 Endocarditis, valve unspecified: Secondary | ICD-10-CM | POA: Diagnosis not present

## 2024-02-28 DIAGNOSIS — D649 Anemia, unspecified: Secondary | ICD-10-CM | POA: Diagnosis not present

## 2024-02-28 DIAGNOSIS — I442 Atrioventricular block, complete: Secondary | ICD-10-CM | POA: Diagnosis not present

## 2024-02-28 DIAGNOSIS — I5082 Biventricular heart failure: Secondary | ICD-10-CM | POA: Diagnosis not present

## 2024-02-28 DIAGNOSIS — Z940 Kidney transplant status: Secondary | ICD-10-CM | POA: Diagnosis not present

## 2024-02-28 DIAGNOSIS — M7989 Other specified soft tissue disorders: Secondary | ICD-10-CM | POA: Diagnosis not present

## 2024-02-28 DIAGNOSIS — R918 Other nonspecific abnormal finding of lung field: Secondary | ICD-10-CM | POA: Diagnosis not present

## 2024-02-28 DIAGNOSIS — I130 Hypertensive heart and chronic kidney disease with heart failure and stage 1 through stage 4 chronic kidney disease, or unspecified chronic kidney disease: Secondary | ICD-10-CM | POA: Diagnosis not present

## 2024-02-28 DIAGNOSIS — R1312 Dysphagia, oropharyngeal phase: Secondary | ICD-10-CM | POA: Diagnosis not present

## 2024-02-28 DIAGNOSIS — D849 Immunodeficiency, unspecified: Secondary | ICD-10-CM | POA: Diagnosis not present

## 2024-02-29 DIAGNOSIS — N179 Acute kidney failure, unspecified: Secondary | ICD-10-CM | POA: Diagnosis not present

## 2024-02-29 DIAGNOSIS — I421 Obstructive hypertrophic cardiomyopathy: Secondary | ICD-10-CM | POA: Diagnosis not present

## 2024-02-29 DIAGNOSIS — I442 Atrioventricular block, complete: Secondary | ICD-10-CM | POA: Diagnosis not present

## 2024-02-29 DIAGNOSIS — R918 Other nonspecific abnormal finding of lung field: Secondary | ICD-10-CM | POA: Diagnosis not present

## 2024-02-29 DIAGNOSIS — J90 Pleural effusion, not elsewhere classified: Secondary | ICD-10-CM | POA: Diagnosis not present

## 2024-02-29 DIAGNOSIS — Z952 Presence of prosthetic heart valve: Secondary | ICD-10-CM | POA: Diagnosis not present

## 2024-02-29 DIAGNOSIS — I38 Endocarditis, valve unspecified: Secondary | ICD-10-CM | POA: Diagnosis not present

## 2024-02-29 DIAGNOSIS — R1312 Dysphagia, oropharyngeal phase: Secondary | ICD-10-CM | POA: Diagnosis not present

## 2024-02-29 DIAGNOSIS — D649 Anemia, unspecified: Secondary | ICD-10-CM | POA: Diagnosis not present

## 2024-02-29 DIAGNOSIS — I5082 Biventricular heart failure: Secondary | ICD-10-CM | POA: Diagnosis not present

## 2024-02-29 DIAGNOSIS — I959 Hypotension, unspecified: Secondary | ICD-10-CM | POA: Diagnosis not present

## 2024-02-29 DIAGNOSIS — N1832 Chronic kidney disease, stage 3b: Secondary | ICD-10-CM | POA: Diagnosis not present

## 2024-02-29 DIAGNOSIS — I130 Hypertensive heart and chronic kidney disease with heart failure and stage 1 through stage 4 chronic kidney disease, or unspecified chronic kidney disease: Secondary | ICD-10-CM | POA: Diagnosis not present

## 2024-02-29 DIAGNOSIS — J9811 Atelectasis: Secondary | ICD-10-CM | POA: Diagnosis not present

## 2024-03-01 DIAGNOSIS — R1312 Dysphagia, oropharyngeal phase: Secondary | ICD-10-CM | POA: Diagnosis not present

## 2024-03-01 DIAGNOSIS — Z952 Presence of prosthetic heart valve: Secondary | ICD-10-CM | POA: Diagnosis not present

## 2024-03-01 DIAGNOSIS — R918 Other nonspecific abnormal finding of lung field: Secondary | ICD-10-CM | POA: Diagnosis not present

## 2024-03-01 DIAGNOSIS — I421 Obstructive hypertrophic cardiomyopathy: Secondary | ICD-10-CM | POA: Diagnosis not present

## 2024-03-01 DIAGNOSIS — Z950 Presence of cardiac pacemaker: Secondary | ICD-10-CM | POA: Diagnosis not present

## 2024-03-02 DIAGNOSIS — R1312 Dysphagia, oropharyngeal phase: Secondary | ICD-10-CM | POA: Diagnosis not present

## 2024-03-02 DIAGNOSIS — J90 Pleural effusion, not elsewhere classified: Secondary | ICD-10-CM | POA: Diagnosis not present

## 2024-03-03 DIAGNOSIS — R4182 Altered mental status, unspecified: Secondary | ICD-10-CM | POA: Diagnosis not present

## 2024-03-03 DIAGNOSIS — J9819 Other pulmonary collapse: Secondary | ICD-10-CM | POA: Diagnosis not present

## 2024-03-03 DIAGNOSIS — R1312 Dysphagia, oropharyngeal phase: Secondary | ICD-10-CM | POA: Diagnosis not present

## 2024-03-03 DIAGNOSIS — N179 Acute kidney failure, unspecified: Secondary | ICD-10-CM | POA: Diagnosis not present

## 2024-03-03 DIAGNOSIS — Z952 Presence of prosthetic heart valve: Secondary | ICD-10-CM | POA: Diagnosis not present

## 2024-03-03 DIAGNOSIS — R931 Abnormal findings on diagnostic imaging of heart and coronary circulation: Secondary | ICD-10-CM | POA: Diagnosis not present

## 2024-03-03 DIAGNOSIS — Z4659 Encounter for fitting and adjustment of other gastrointestinal appliance and device: Secondary | ICD-10-CM | POA: Diagnosis not present

## 2024-03-03 DIAGNOSIS — E875 Hyperkalemia: Secondary | ICD-10-CM | POA: Diagnosis not present

## 2024-03-03 DIAGNOSIS — J90 Pleural effusion, not elsewhere classified: Secondary | ICD-10-CM | POA: Diagnosis not present

## 2024-03-03 DIAGNOSIS — R29818 Other symptoms and signs involving the nervous system: Secondary | ICD-10-CM | POA: Diagnosis not present

## 2024-03-03 DIAGNOSIS — J9690 Respiratory failure, unspecified, unspecified whether with hypoxia or hypercapnia: Secondary | ICD-10-CM | POA: Diagnosis not present

## 2024-03-03 DIAGNOSIS — I959 Hypotension, unspecified: Secondary | ICD-10-CM | POA: Diagnosis not present

## 2024-03-03 DIAGNOSIS — I4891 Unspecified atrial fibrillation: Secondary | ICD-10-CM | POA: Diagnosis not present

## 2024-03-03 DIAGNOSIS — Z9889 Other specified postprocedural states: Secondary | ICD-10-CM | POA: Diagnosis not present

## 2024-03-03 DIAGNOSIS — N189 Chronic kidney disease, unspecified: Secondary | ICD-10-CM | POA: Diagnosis not present

## 2024-03-03 DIAGNOSIS — R918 Other nonspecific abnormal finding of lung field: Secondary | ICD-10-CM | POA: Diagnosis not present

## 2024-03-03 DIAGNOSIS — Z4682 Encounter for fitting and adjustment of non-vascular catheter: Secondary | ICD-10-CM | POA: Diagnosis not present

## 2024-03-03 DIAGNOSIS — J9811 Atelectasis: Secondary | ICD-10-CM | POA: Diagnosis not present

## 2024-03-04 DIAGNOSIS — N179 Acute kidney failure, unspecified: Secondary | ICD-10-CM | POA: Diagnosis not present

## 2024-03-04 DIAGNOSIS — J9819 Other pulmonary collapse: Secondary | ICD-10-CM | POA: Diagnosis not present

## 2024-03-04 DIAGNOSIS — I959 Hypotension, unspecified: Secondary | ICD-10-CM | POA: Diagnosis not present

## 2024-03-04 DIAGNOSIS — R918 Other nonspecific abnormal finding of lung field: Secondary | ICD-10-CM | POA: Diagnosis not present

## 2024-03-04 DIAGNOSIS — I4891 Unspecified atrial fibrillation: Secondary | ICD-10-CM | POA: Diagnosis not present

## 2024-03-04 DIAGNOSIS — N189 Chronic kidney disease, unspecified: Secondary | ICD-10-CM | POA: Diagnosis not present

## 2024-03-04 DIAGNOSIS — J90 Pleural effusion, not elsewhere classified: Secondary | ICD-10-CM | POA: Diagnosis not present

## 2024-03-04 DIAGNOSIS — R1312 Dysphagia, oropharyngeal phase: Secondary | ICD-10-CM | POA: Diagnosis not present

## 2024-03-04 DIAGNOSIS — Z4682 Encounter for fitting and adjustment of non-vascular catheter: Secondary | ICD-10-CM | POA: Diagnosis not present

## 2024-03-04 DIAGNOSIS — E875 Hyperkalemia: Secondary | ICD-10-CM | POA: Diagnosis not present

## 2024-03-04 DIAGNOSIS — Z452 Encounter for adjustment and management of vascular access device: Secondary | ICD-10-CM | POA: Diagnosis not present

## 2024-03-04 DIAGNOSIS — Z952 Presence of prosthetic heart valve: Secondary | ICD-10-CM | POA: Diagnosis not present

## 2024-03-04 DIAGNOSIS — J9811 Atelectasis: Secondary | ICD-10-CM | POA: Diagnosis not present

## 2024-03-04 DIAGNOSIS — J9690 Respiratory failure, unspecified, unspecified whether with hypoxia or hypercapnia: Secondary | ICD-10-CM | POA: Diagnosis not present

## 2024-03-04 DIAGNOSIS — R4182 Altered mental status, unspecified: Secondary | ICD-10-CM | POA: Diagnosis not present

## 2024-03-05 DIAGNOSIS — Z4682 Encounter for fitting and adjustment of non-vascular catheter: Secondary | ICD-10-CM | POA: Diagnosis not present

## 2024-03-05 DIAGNOSIS — Z4659 Encounter for fitting and adjustment of other gastrointestinal appliance and device: Secondary | ICD-10-CM | POA: Diagnosis not present

## 2024-03-05 DIAGNOSIS — J9692 Respiratory failure, unspecified with hypercapnia: Secondary | ICD-10-CM | POA: Diagnosis not present

## 2024-03-05 DIAGNOSIS — R1312 Dysphagia, oropharyngeal phase: Secondary | ICD-10-CM | POA: Diagnosis not present

## 2024-03-05 DIAGNOSIS — D631 Anemia in chronic kidney disease: Secondary | ICD-10-CM | POA: Diagnosis not present

## 2024-03-05 DIAGNOSIS — D62 Acute posthemorrhagic anemia: Secondary | ICD-10-CM | POA: Diagnosis not present

## 2024-03-05 DIAGNOSIS — R4182 Altered mental status, unspecified: Secondary | ICD-10-CM | POA: Diagnosis not present

## 2024-03-05 DIAGNOSIS — N179 Acute kidney failure, unspecified: Secondary | ICD-10-CM | POA: Diagnosis not present

## 2024-03-05 DIAGNOSIS — R931 Abnormal findings on diagnostic imaging of heart and coronary circulation: Secondary | ICD-10-CM | POA: Diagnosis not present

## 2024-03-05 DIAGNOSIS — Z952 Presence of prosthetic heart valve: Secondary | ICD-10-CM | POA: Diagnosis not present

## 2024-03-05 DIAGNOSIS — E46 Unspecified protein-calorie malnutrition: Secondary | ICD-10-CM | POA: Diagnosis not present

## 2024-03-05 DIAGNOSIS — N189 Chronic kidney disease, unspecified: Secondary | ICD-10-CM | POA: Diagnosis not present

## 2024-03-05 DIAGNOSIS — R918 Other nonspecific abnormal finding of lung field: Secondary | ICD-10-CM | POA: Diagnosis not present

## 2024-03-05 DIAGNOSIS — Z9889 Other specified postprocedural states: Secondary | ICD-10-CM | POA: Diagnosis not present

## 2024-03-05 DIAGNOSIS — I4819 Other persistent atrial fibrillation: Secondary | ICD-10-CM | POA: Diagnosis not present

## 2024-03-05 DIAGNOSIS — Z452 Encounter for adjustment and management of vascular access device: Secondary | ICD-10-CM | POA: Diagnosis not present

## 2024-03-06 DIAGNOSIS — Z4659 Encounter for fitting and adjustment of other gastrointestinal appliance and device: Secondary | ICD-10-CM | POA: Diagnosis not present

## 2024-03-06 DIAGNOSIS — J9692 Respiratory failure, unspecified with hypercapnia: Secondary | ICD-10-CM | POA: Diagnosis not present

## 2024-03-06 DIAGNOSIS — R4182 Altered mental status, unspecified: Secondary | ICD-10-CM | POA: Diagnosis not present

## 2024-03-06 DIAGNOSIS — D62 Acute posthemorrhagic anemia: Secondary | ICD-10-CM | POA: Diagnosis not present

## 2024-03-06 DIAGNOSIS — D631 Anemia in chronic kidney disease: Secondary | ICD-10-CM | POA: Diagnosis not present

## 2024-03-06 DIAGNOSIS — N179 Acute kidney failure, unspecified: Secondary | ICD-10-CM | POA: Diagnosis not present

## 2024-03-06 DIAGNOSIS — I421 Obstructive hypertrophic cardiomyopathy: Secondary | ICD-10-CM | POA: Diagnosis not present

## 2024-03-06 DIAGNOSIS — Z952 Presence of prosthetic heart valve: Secondary | ICD-10-CM | POA: Diagnosis not present

## 2024-03-06 DIAGNOSIS — I4819 Other persistent atrial fibrillation: Secondary | ICD-10-CM | POA: Diagnosis not present

## 2024-03-06 DIAGNOSIS — N189 Chronic kidney disease, unspecified: Secondary | ICD-10-CM | POA: Diagnosis not present

## 2024-03-06 DIAGNOSIS — E46 Unspecified protein-calorie malnutrition: Secondary | ICD-10-CM | POA: Diagnosis not present

## 2024-03-06 DIAGNOSIS — J90 Pleural effusion, not elsewhere classified: Secondary | ICD-10-CM | POA: Diagnosis not present

## 2024-03-06 DIAGNOSIS — J9811 Atelectasis: Secondary | ICD-10-CM | POA: Diagnosis not present

## 2024-03-06 DIAGNOSIS — Z452 Encounter for adjustment and management of vascular access device: Secondary | ICD-10-CM | POA: Diagnosis not present

## 2024-03-07 DIAGNOSIS — N189 Chronic kidney disease, unspecified: Secondary | ICD-10-CM | POA: Diagnosis not present

## 2024-03-07 DIAGNOSIS — Z4682 Encounter for fitting and adjustment of non-vascular catheter: Secondary | ICD-10-CM | POA: Diagnosis not present

## 2024-03-07 DIAGNOSIS — I4819 Other persistent atrial fibrillation: Secondary | ICD-10-CM | POA: Diagnosis not present

## 2024-03-07 DIAGNOSIS — Z952 Presence of prosthetic heart valve: Secondary | ICD-10-CM | POA: Diagnosis not present

## 2024-03-07 DIAGNOSIS — J9692 Respiratory failure, unspecified with hypercapnia: Secondary | ICD-10-CM | POA: Diagnosis not present

## 2024-03-07 DIAGNOSIS — E46 Unspecified protein-calorie malnutrition: Secondary | ICD-10-CM | POA: Diagnosis not present

## 2024-03-07 DIAGNOSIS — J90 Pleural effusion, not elsewhere classified: Secondary | ICD-10-CM | POA: Diagnosis not present

## 2024-03-07 DIAGNOSIS — Z452 Encounter for adjustment and management of vascular access device: Secondary | ICD-10-CM | POA: Diagnosis not present

## 2024-03-07 DIAGNOSIS — R4182 Altered mental status, unspecified: Secondary | ICD-10-CM | POA: Diagnosis not present

## 2024-03-07 DIAGNOSIS — D62 Acute posthemorrhagic anemia: Secondary | ICD-10-CM | POA: Diagnosis not present

## 2024-03-07 DIAGNOSIS — I517 Cardiomegaly: Secondary | ICD-10-CM | POA: Diagnosis not present

## 2024-03-07 DIAGNOSIS — N179 Acute kidney failure, unspecified: Secondary | ICD-10-CM | POA: Diagnosis not present

## 2024-03-07 DIAGNOSIS — D631 Anemia in chronic kidney disease: Secondary | ICD-10-CM | POA: Diagnosis not present

## 2024-03-07 DIAGNOSIS — J9811 Atelectasis: Secondary | ICD-10-CM | POA: Diagnosis not present

## 2024-03-08 DIAGNOSIS — R1312 Dysphagia, oropharyngeal phase: Secondary | ICD-10-CM | POA: Diagnosis not present

## 2024-03-08 DIAGNOSIS — Z952 Presence of prosthetic heart valve: Secondary | ICD-10-CM | POA: Diagnosis not present

## 2024-03-08 DIAGNOSIS — Z452 Encounter for adjustment and management of vascular access device: Secondary | ICD-10-CM | POA: Diagnosis not present

## 2024-03-08 DIAGNOSIS — D849 Immunodeficiency, unspecified: Secondary | ICD-10-CM | POA: Diagnosis not present

## 2024-03-08 DIAGNOSIS — J9811 Atelectasis: Secondary | ICD-10-CM | POA: Diagnosis not present

## 2024-03-08 DIAGNOSIS — Z940 Kidney transplant status: Secondary | ICD-10-CM | POA: Diagnosis not present

## 2024-03-08 DIAGNOSIS — Z4682 Encounter for fitting and adjustment of non-vascular catheter: Secondary | ICD-10-CM | POA: Diagnosis not present

## 2024-03-08 DIAGNOSIS — J90 Pleural effusion, not elsewhere classified: Secondary | ICD-10-CM | POA: Diagnosis not present

## 2024-03-08 DIAGNOSIS — N179 Acute kidney failure, unspecified: Secondary | ICD-10-CM | POA: Diagnosis not present

## 2024-03-09 DIAGNOSIS — R1312 Dysphagia, oropharyngeal phase: Secondary | ICD-10-CM | POA: Diagnosis not present

## 2024-03-09 DIAGNOSIS — Z4682 Encounter for fitting and adjustment of non-vascular catheter: Secondary | ICD-10-CM | POA: Diagnosis not present

## 2024-03-09 DIAGNOSIS — Z952 Presence of prosthetic heart valve: Secondary | ICD-10-CM | POA: Diagnosis not present

## 2024-03-09 DIAGNOSIS — J9811 Atelectasis: Secondary | ICD-10-CM | POA: Diagnosis not present

## 2024-03-09 DIAGNOSIS — Z452 Encounter for adjustment and management of vascular access device: Secondary | ICD-10-CM | POA: Diagnosis not present

## 2024-03-09 DIAGNOSIS — N179 Acute kidney failure, unspecified: Secondary | ICD-10-CM | POA: Diagnosis not present

## 2024-03-09 DIAGNOSIS — Z940 Kidney transplant status: Secondary | ICD-10-CM | POA: Diagnosis not present

## 2024-03-09 DIAGNOSIS — N472 Paraphimosis: Secondary | ICD-10-CM | POA: Diagnosis not present

## 2024-03-09 DIAGNOSIS — J90 Pleural effusion, not elsewhere classified: Secondary | ICD-10-CM | POA: Diagnosis not present

## 2024-03-09 DIAGNOSIS — D849 Immunodeficiency, unspecified: Secondary | ICD-10-CM | POA: Diagnosis not present

## 2024-03-10 DIAGNOSIS — D849 Immunodeficiency, unspecified: Secondary | ICD-10-CM | POA: Diagnosis not present

## 2024-03-10 DIAGNOSIS — R1312 Dysphagia, oropharyngeal phase: Secondary | ICD-10-CM | POA: Diagnosis not present

## 2024-03-10 DIAGNOSIS — N179 Acute kidney failure, unspecified: Secondary | ICD-10-CM | POA: Diagnosis not present

## 2024-03-10 DIAGNOSIS — I421 Obstructive hypertrophic cardiomyopathy: Secondary | ICD-10-CM | POA: Diagnosis not present

## 2024-03-10 DIAGNOSIS — I340 Nonrheumatic mitral (valve) insufficiency: Secondary | ICD-10-CM | POA: Diagnosis not present

## 2024-03-10 DIAGNOSIS — Z452 Encounter for adjustment and management of vascular access device: Secondary | ICD-10-CM | POA: Diagnosis not present

## 2024-03-10 DIAGNOSIS — Z940 Kidney transplant status: Secondary | ICD-10-CM | POA: Diagnosis not present

## 2024-03-10 DIAGNOSIS — Z4682 Encounter for fitting and adjustment of non-vascular catheter: Secondary | ICD-10-CM | POA: Diagnosis not present

## 2024-03-10 DIAGNOSIS — J90 Pleural effusion, not elsewhere classified: Secondary | ICD-10-CM | POA: Diagnosis not present

## 2024-03-11 DIAGNOSIS — Z4682 Encounter for fitting and adjustment of non-vascular catheter: Secondary | ICD-10-CM | POA: Diagnosis not present

## 2024-03-11 DIAGNOSIS — I421 Obstructive hypertrophic cardiomyopathy: Secondary | ICD-10-CM | POA: Diagnosis not present

## 2024-03-11 DIAGNOSIS — R1312 Dysphagia, oropharyngeal phase: Secondary | ICD-10-CM | POA: Diagnosis not present

## 2024-03-11 DIAGNOSIS — Z952 Presence of prosthetic heart valve: Secondary | ICD-10-CM | POA: Diagnosis not present

## 2024-03-11 DIAGNOSIS — D849 Immunodeficiency, unspecified: Secondary | ICD-10-CM | POA: Diagnosis not present

## 2024-03-11 DIAGNOSIS — J90 Pleural effusion, not elsewhere classified: Secondary | ICD-10-CM | POA: Diagnosis not present

## 2024-03-11 DIAGNOSIS — N179 Acute kidney failure, unspecified: Secondary | ICD-10-CM | POA: Diagnosis not present

## 2024-03-11 DIAGNOSIS — Z940 Kidney transplant status: Secondary | ICD-10-CM | POA: Diagnosis not present

## 2024-03-11 DIAGNOSIS — Z452 Encounter for adjustment and management of vascular access device: Secondary | ICD-10-CM | POA: Diagnosis not present

## 2024-03-12 DIAGNOSIS — N179 Acute kidney failure, unspecified: Secondary | ICD-10-CM | POA: Diagnosis not present

## 2024-03-12 DIAGNOSIS — D849 Immunodeficiency, unspecified: Secondary | ICD-10-CM | POA: Diagnosis not present

## 2024-03-12 DIAGNOSIS — R1312 Dysphagia, oropharyngeal phase: Secondary | ICD-10-CM | POA: Diagnosis not present

## 2024-03-12 DIAGNOSIS — Z940 Kidney transplant status: Secondary | ICD-10-CM | POA: Diagnosis not present

## 2024-03-13 DIAGNOSIS — J948 Other specified pleural conditions: Secondary | ICD-10-CM | POA: Diagnosis not present

## 2024-03-13 DIAGNOSIS — I340 Nonrheumatic mitral (valve) insufficiency: Secondary | ICD-10-CM | POA: Diagnosis not present

## 2024-03-13 DIAGNOSIS — I421 Obstructive hypertrophic cardiomyopathy: Secondary | ICD-10-CM | POA: Diagnosis not present

## 2024-03-13 DIAGNOSIS — Z452 Encounter for adjustment and management of vascular access device: Secondary | ICD-10-CM | POA: Diagnosis not present

## 2024-03-13 DIAGNOSIS — R918 Other nonspecific abnormal finding of lung field: Secondary | ICD-10-CM | POA: Diagnosis not present

## 2024-03-13 DIAGNOSIS — Z4682 Encounter for fitting and adjustment of non-vascular catheter: Secondary | ICD-10-CM | POA: Diagnosis not present

## 2024-03-14 DIAGNOSIS — J90 Pleural effusion, not elsewhere classified: Secondary | ICD-10-CM | POA: Diagnosis not present

## 2024-03-14 DIAGNOSIS — Z4682 Encounter for fitting and adjustment of non-vascular catheter: Secondary | ICD-10-CM | POA: Diagnosis not present

## 2024-03-14 DIAGNOSIS — Z952 Presence of prosthetic heart valve: Secondary | ICD-10-CM | POA: Diagnosis not present

## 2024-03-14 DIAGNOSIS — Z452 Encounter for adjustment and management of vascular access device: Secondary | ICD-10-CM | POA: Diagnosis not present

## 2024-03-14 DIAGNOSIS — I421 Obstructive hypertrophic cardiomyopathy: Secondary | ICD-10-CM | POA: Diagnosis not present

## 2024-03-14 DIAGNOSIS — I340 Nonrheumatic mitral (valve) insufficiency: Secondary | ICD-10-CM | POA: Diagnosis not present

## 2024-03-14 DIAGNOSIS — R918 Other nonspecific abnormal finding of lung field: Secondary | ICD-10-CM | POA: Diagnosis not present

## 2024-03-15 DIAGNOSIS — Z79621 Long term (current) use of calcineurin inhibitor: Secondary | ICD-10-CM | POA: Diagnosis not present

## 2024-03-15 DIAGNOSIS — I421 Obstructive hypertrophic cardiomyopathy: Secondary | ICD-10-CM | POA: Diagnosis not present

## 2024-03-15 DIAGNOSIS — Z940 Kidney transplant status: Secondary | ICD-10-CM | POA: Diagnosis not present

## 2024-03-15 DIAGNOSIS — J90 Pleural effusion, not elsewhere classified: Secondary | ICD-10-CM | POA: Diagnosis not present

## 2024-03-15 DIAGNOSIS — Z4682 Encounter for fitting and adjustment of non-vascular catheter: Secondary | ICD-10-CM | POA: Diagnosis not present

## 2024-03-15 DIAGNOSIS — Z5181 Encounter for therapeutic drug level monitoring: Secondary | ICD-10-CM | POA: Diagnosis not present

## 2024-03-15 DIAGNOSIS — Z452 Encounter for adjustment and management of vascular access device: Secondary | ICD-10-CM | POA: Diagnosis not present

## 2024-03-15 DIAGNOSIS — N179 Acute kidney failure, unspecified: Secondary | ICD-10-CM | POA: Diagnosis not present

## 2024-03-15 DIAGNOSIS — I340 Nonrheumatic mitral (valve) insufficiency: Secondary | ICD-10-CM | POA: Diagnosis not present

## 2024-03-16 DIAGNOSIS — N400 Enlarged prostate without lower urinary tract symptoms: Secondary | ICD-10-CM | POA: Diagnosis not present

## 2024-03-16 DIAGNOSIS — Z4682 Encounter for fitting and adjustment of non-vascular catheter: Secondary | ICD-10-CM | POA: Diagnosis not present

## 2024-03-16 DIAGNOSIS — J90 Pleural effusion, not elsewhere classified: Secondary | ICD-10-CM | POA: Diagnosis not present

## 2024-03-16 DIAGNOSIS — Z952 Presence of prosthetic heart valve: Secondary | ICD-10-CM | POA: Diagnosis not present

## 2024-03-16 DIAGNOSIS — E782 Mixed hyperlipidemia: Secondary | ICD-10-CM | POA: Diagnosis not present

## 2024-03-16 DIAGNOSIS — Q249 Congenital malformation of heart, unspecified: Secondary | ICD-10-CM | POA: Diagnosis not present

## 2024-03-16 DIAGNOSIS — Z452 Encounter for adjustment and management of vascular access device: Secondary | ICD-10-CM | POA: Diagnosis not present

## 2024-03-16 DIAGNOSIS — I340 Nonrheumatic mitral (valve) insufficiency: Secondary | ICD-10-CM | POA: Diagnosis not present

## 2024-03-16 DIAGNOSIS — R262 Difficulty in walking, not elsewhere classified: Secondary | ICD-10-CM | POA: Diagnosis not present

## 2024-03-16 DIAGNOSIS — D631 Anemia in chronic kidney disease: Secondary | ICD-10-CM | POA: Diagnosis not present

## 2024-03-16 DIAGNOSIS — N1832 Chronic kidney disease, stage 3b: Secondary | ICD-10-CM | POA: Diagnosis not present

## 2024-03-16 DIAGNOSIS — I421 Obstructive hypertrophic cardiomyopathy: Secondary | ICD-10-CM | POA: Diagnosis not present

## 2024-03-16 DIAGNOSIS — J9811 Atelectasis: Secondary | ICD-10-CM | POA: Diagnosis not present

## 2024-03-16 DIAGNOSIS — Z940 Kidney transplant status: Secondary | ICD-10-CM | POA: Diagnosis not present

## 2024-03-16 DIAGNOSIS — I330 Acute and subacute infective endocarditis: Secondary | ICD-10-CM | POA: Diagnosis not present

## 2024-03-16 DIAGNOSIS — M6281 Muscle weakness (generalized): Secondary | ICD-10-CM | POA: Diagnosis not present

## 2024-03-16 DIAGNOSIS — J948 Other specified pleural conditions: Secondary | ICD-10-CM | POA: Diagnosis not present

## 2024-03-16 DIAGNOSIS — K219 Gastro-esophageal reflux disease without esophagitis: Secondary | ICD-10-CM | POA: Diagnosis not present

## 2024-03-16 DIAGNOSIS — I442 Atrioventricular block, complete: Secondary | ICD-10-CM | POA: Diagnosis not present

## 2024-03-16 DIAGNOSIS — I951 Orthostatic hypotension: Secondary | ICD-10-CM | POA: Diagnosis not present

## 2024-03-16 DIAGNOSIS — N186 End stage renal disease: Secondary | ICD-10-CM | POA: Diagnosis not present

## 2024-03-16 DIAGNOSIS — Z9889 Other specified postprocedural states: Secondary | ICD-10-CM | POA: Diagnosis not present

## 2024-03-16 DIAGNOSIS — N183 Chronic kidney disease, stage 3 unspecified: Secondary | ICD-10-CM | POA: Diagnosis not present

## 2024-03-16 DIAGNOSIS — I959 Hypotension, unspecified: Secondary | ICD-10-CM | POA: Diagnosis not present

## 2024-03-17 DIAGNOSIS — I340 Nonrheumatic mitral (valve) insufficiency: Secondary | ICD-10-CM | POA: Diagnosis not present

## 2024-03-17 DIAGNOSIS — I951 Orthostatic hypotension: Secondary | ICD-10-CM | POA: Diagnosis not present

## 2024-03-17 DIAGNOSIS — Z940 Kidney transplant status: Secondary | ICD-10-CM | POA: Diagnosis not present

## 2024-03-17 DIAGNOSIS — K219 Gastro-esophageal reflux disease without esophagitis: Secondary | ICD-10-CM | POA: Diagnosis not present

## 2024-03-17 DIAGNOSIS — E782 Mixed hyperlipidemia: Secondary | ICD-10-CM | POA: Diagnosis not present

## 2024-03-17 DIAGNOSIS — N400 Enlarged prostate without lower urinary tract symptoms: Secondary | ICD-10-CM | POA: Diagnosis not present

## 2024-03-17 DIAGNOSIS — I421 Obstructive hypertrophic cardiomyopathy: Secondary | ICD-10-CM | POA: Diagnosis not present

## 2024-03-17 DIAGNOSIS — N183 Chronic kidney disease, stage 3 unspecified: Secondary | ICD-10-CM | POA: Diagnosis not present

## 2024-03-17 DIAGNOSIS — N1832 Chronic kidney disease, stage 3b: Secondary | ICD-10-CM | POA: Diagnosis not present

## 2024-03-18 DIAGNOSIS — Z952 Presence of prosthetic heart valve: Secondary | ICD-10-CM | POA: Diagnosis not present

## 2024-03-18 DIAGNOSIS — I340 Nonrheumatic mitral (valve) insufficiency: Secondary | ICD-10-CM | POA: Diagnosis not present

## 2024-03-18 DIAGNOSIS — D631 Anemia in chronic kidney disease: Secondary | ICD-10-CM | POA: Diagnosis not present

## 2024-03-18 DIAGNOSIS — I421 Obstructive hypertrophic cardiomyopathy: Secondary | ICD-10-CM | POA: Diagnosis not present

## 2024-03-18 DIAGNOSIS — I951 Orthostatic hypotension: Secondary | ICD-10-CM | POA: Diagnosis not present

## 2024-03-18 DIAGNOSIS — Z940 Kidney transplant status: Secondary | ICD-10-CM | POA: Diagnosis not present

## 2024-03-19 DIAGNOSIS — N183 Chronic kidney disease, stage 3 unspecified: Secondary | ICD-10-CM | POA: Diagnosis not present

## 2024-03-19 DIAGNOSIS — Z940 Kidney transplant status: Secondary | ICD-10-CM | POA: Diagnosis not present

## 2024-03-19 DIAGNOSIS — K219 Gastro-esophageal reflux disease without esophagitis: Secondary | ICD-10-CM | POA: Diagnosis not present

## 2024-03-19 DIAGNOSIS — I340 Nonrheumatic mitral (valve) insufficiency: Secondary | ICD-10-CM | POA: Diagnosis not present

## 2024-03-19 DIAGNOSIS — E782 Mixed hyperlipidemia: Secondary | ICD-10-CM | POA: Diagnosis not present

## 2024-03-19 DIAGNOSIS — N1832 Chronic kidney disease, stage 3b: Secondary | ICD-10-CM | POA: Diagnosis not present

## 2024-03-19 DIAGNOSIS — I421 Obstructive hypertrophic cardiomyopathy: Secondary | ICD-10-CM | POA: Diagnosis not present

## 2024-03-19 DIAGNOSIS — N400 Enlarged prostate without lower urinary tract symptoms: Secondary | ICD-10-CM | POA: Diagnosis not present

## 2024-03-19 DIAGNOSIS — I951 Orthostatic hypotension: Secondary | ICD-10-CM | POA: Diagnosis not present

## 2024-03-29 DIAGNOSIS — R2689 Other abnormalities of gait and mobility: Secondary | ICD-10-CM | POA: Diagnosis not present

## 2024-03-29 DIAGNOSIS — M6281 Muscle weakness (generalized): Secondary | ICD-10-CM | POA: Diagnosis not present

## 2024-03-31 ENCOUNTER — Telehealth: Payer: Self-pay | Admitting: *Deleted

## 2024-03-31 DIAGNOSIS — Z9889 Other specified postprocedural states: Secondary | ICD-10-CM | POA: Diagnosis not present

## 2024-03-31 DIAGNOSIS — R9431 Abnormal electrocardiogram [ECG] [EKG]: Secondary | ICD-10-CM | POA: Diagnosis not present

## 2024-03-31 DIAGNOSIS — M6281 Muscle weakness (generalized): Secondary | ICD-10-CM | POA: Diagnosis not present

## 2024-03-31 DIAGNOSIS — J90 Pleural effusion, not elsewhere classified: Secondary | ICD-10-CM | POA: Diagnosis not present

## 2024-03-31 DIAGNOSIS — R2689 Other abnormalities of gait and mobility: Secondary | ICD-10-CM | POA: Diagnosis not present

## 2024-03-31 DIAGNOSIS — I517 Cardiomegaly: Secondary | ICD-10-CM | POA: Diagnosis not present

## 2024-03-31 DIAGNOSIS — Z952 Presence of prosthetic heart valve: Secondary | ICD-10-CM | POA: Diagnosis not present

## 2024-03-31 DIAGNOSIS — Z48812 Encounter for surgical aftercare following surgery on the circulatory system: Secondary | ICD-10-CM | POA: Diagnosis not present

## 2024-03-31 DIAGNOSIS — I4589 Other specified conduction disorders: Secondary | ICD-10-CM | POA: Diagnosis not present

## 2024-03-31 NOTE — Telephone Encounter (Signed)
 Per Dr Bluford: Recommend due to patient complex cardiac history, recommends to continue to be managed thru cardiology. Patient notified of provider's recommendations.

## 2024-03-31 NOTE — Telephone Encounter (Signed)
 Copied from CRM [id number]. Topic: Clinical - Medical Advice >> Mar 31, 2024  1:23 PM Essie A wrote: Reason for CRM: Patient's heart surgeons would like to know if Dr. Bluford would manage his INR.  Please return his call at [phone].  Thanks.

## 2024-04-05 DIAGNOSIS — Z940 Kidney transplant status: Secondary | ICD-10-CM | POA: Diagnosis not present

## 2024-04-05 DIAGNOSIS — Z952 Presence of prosthetic heart valve: Secondary | ICD-10-CM | POA: Diagnosis not present

## 2024-04-05 DIAGNOSIS — Z9889 Other specified postprocedural states: Secondary | ICD-10-CM | POA: Diagnosis not present

## 2024-04-06 DIAGNOSIS — M6281 Muscle weakness (generalized): Secondary | ICD-10-CM | POA: Diagnosis not present

## 2024-04-06 DIAGNOSIS — R2689 Other abnormalities of gait and mobility: Secondary | ICD-10-CM | POA: Diagnosis not present

## 2024-04-09 DIAGNOSIS — R2689 Other abnormalities of gait and mobility: Secondary | ICD-10-CM | POA: Diagnosis not present

## 2024-04-09 DIAGNOSIS — M6281 Muscle weakness (generalized): Secondary | ICD-10-CM | POA: Diagnosis not present

## 2024-04-09 DIAGNOSIS — Z952 Presence of prosthetic heart valve: Secondary | ICD-10-CM | POA: Diagnosis not present

## 2024-04-20 DIAGNOSIS — R2689 Other abnormalities of gait and mobility: Secondary | ICD-10-CM | POA: Diagnosis not present

## 2024-04-20 DIAGNOSIS — Z952 Presence of prosthetic heart valve: Secondary | ICD-10-CM | POA: Diagnosis not present

## 2024-04-20 DIAGNOSIS — M6281 Muscle weakness (generalized): Secondary | ICD-10-CM | POA: Diagnosis not present

## 2024-04-22 DIAGNOSIS — I340 Nonrheumatic mitral (valve) insufficiency: Secondary | ICD-10-CM | POA: Diagnosis not present

## 2024-04-22 DIAGNOSIS — I10 Essential (primary) hypertension: Secondary | ICD-10-CM | POA: Diagnosis not present

## 2024-04-22 DIAGNOSIS — Z952 Presence of prosthetic heart valve: Secondary | ICD-10-CM | POA: Diagnosis not present

## 2024-04-22 DIAGNOSIS — Z7901 Long term (current) use of anticoagulants: Secondary | ICD-10-CM | POA: Diagnosis not present

## 2024-04-22 DIAGNOSIS — I951 Orthostatic hypotension: Secondary | ICD-10-CM | POA: Diagnosis not present

## 2024-04-22 DIAGNOSIS — Z9889 Other specified postprocedural states: Secondary | ICD-10-CM | POA: Diagnosis not present

## 2024-04-22 DIAGNOSIS — Z79899 Other long term (current) drug therapy: Secondary | ICD-10-CM | POA: Diagnosis not present

## 2024-04-22 DIAGNOSIS — I421 Obstructive hypertrophic cardiomyopathy: Secondary | ICD-10-CM | POA: Diagnosis not present

## 2024-04-22 DIAGNOSIS — Z5181 Encounter for therapeutic drug level monitoring: Secondary | ICD-10-CM | POA: Diagnosis not present

## 2024-04-22 DIAGNOSIS — Z48812 Encounter for surgical aftercare following surgery on the circulatory system: Secondary | ICD-10-CM | POA: Diagnosis not present

## 2024-04-26 DIAGNOSIS — Z940 Kidney transplant status: Secondary | ICD-10-CM | POA: Diagnosis not present

## 2024-04-26 DIAGNOSIS — Z952 Presence of prosthetic heart valve: Secondary | ICD-10-CM | POA: Diagnosis not present

## 2024-04-26 DIAGNOSIS — D849 Immunodeficiency, unspecified: Secondary | ICD-10-CM | POA: Diagnosis not present

## 2024-04-26 DIAGNOSIS — N1832 Chronic kidney disease, stage 3b: Secondary | ICD-10-CM | POA: Diagnosis not present

## 2024-04-27 DIAGNOSIS — M6281 Muscle weakness (generalized): Secondary | ICD-10-CM | POA: Diagnosis not present

## 2024-04-27 DIAGNOSIS — R2689 Other abnormalities of gait and mobility: Secondary | ICD-10-CM | POA: Diagnosis not present

## 2024-04-28 DIAGNOSIS — Z952 Presence of prosthetic heart valve: Secondary | ICD-10-CM | POA: Diagnosis not present

## 2024-04-30 DIAGNOSIS — R2689 Other abnormalities of gait and mobility: Secondary | ICD-10-CM | POA: Diagnosis not present

## 2024-04-30 DIAGNOSIS — M6281 Muscle weakness (generalized): Secondary | ICD-10-CM | POA: Diagnosis not present

## 2024-05-03 DIAGNOSIS — Z952 Presence of prosthetic heart valve: Secondary | ICD-10-CM | POA: Diagnosis not present

## 2024-05-04 DIAGNOSIS — R2689 Other abnormalities of gait and mobility: Secondary | ICD-10-CM | POA: Diagnosis not present

## 2024-05-04 DIAGNOSIS — M6281 Muscle weakness (generalized): Secondary | ICD-10-CM | POA: Diagnosis not present

## 2024-05-05 DIAGNOSIS — Z952 Presence of prosthetic heart valve: Secondary | ICD-10-CM | POA: Diagnosis not present

## 2024-05-07 DIAGNOSIS — R2689 Other abnormalities of gait and mobility: Secondary | ICD-10-CM | POA: Diagnosis not present

## 2024-05-07 DIAGNOSIS — M6281 Muscle weakness (generalized): Secondary | ICD-10-CM | POA: Diagnosis not present

## 2024-05-10 DIAGNOSIS — Z952 Presence of prosthetic heart valve: Secondary | ICD-10-CM | POA: Diagnosis not present

## 2024-05-11 DIAGNOSIS — Z7901 Long term (current) use of anticoagulants: Secondary | ICD-10-CM | POA: Diagnosis not present

## 2024-05-11 DIAGNOSIS — I421 Obstructive hypertrophic cardiomyopathy: Secondary | ICD-10-CM | POA: Diagnosis not present

## 2024-05-11 DIAGNOSIS — N401 Enlarged prostate with lower urinary tract symptoms: Secondary | ICD-10-CM | POA: Diagnosis not present

## 2024-05-11 DIAGNOSIS — N138 Other obstructive and reflux uropathy: Secondary | ICD-10-CM | POA: Diagnosis not present

## 2024-05-11 DIAGNOSIS — N200 Calculus of kidney: Secondary | ICD-10-CM | POA: Diagnosis not present

## 2024-05-11 DIAGNOSIS — Z9889 Other specified postprocedural states: Secondary | ICD-10-CM | POA: Diagnosis not present

## 2024-05-11 DIAGNOSIS — Z952 Presence of prosthetic heart valve: Secondary | ICD-10-CM | POA: Diagnosis not present

## 2024-05-11 DIAGNOSIS — Z940 Kidney transplant status: Secondary | ICD-10-CM | POA: Diagnosis not present

## 2024-05-11 DIAGNOSIS — I517 Cardiomegaly: Secondary | ICD-10-CM | POA: Diagnosis not present

## 2024-05-12 DIAGNOSIS — R2689 Other abnormalities of gait and mobility: Secondary | ICD-10-CM | POA: Diagnosis not present

## 2024-05-17 DIAGNOSIS — R2689 Other abnormalities of gait and mobility: Secondary | ICD-10-CM | POA: Diagnosis not present

## 2024-05-17 DIAGNOSIS — M6281 Muscle weakness (generalized): Secondary | ICD-10-CM | POA: Diagnosis not present

## 2024-05-20 DIAGNOSIS — Z952 Presence of prosthetic heart valve: Secondary | ICD-10-CM | POA: Diagnosis not present

## 2024-05-21 DIAGNOSIS — R2689 Other abnormalities of gait and mobility: Secondary | ICD-10-CM | POA: Diagnosis not present

## 2024-05-21 DIAGNOSIS — M6281 Muscle weakness (generalized): Secondary | ICD-10-CM | POA: Diagnosis not present

## 2024-05-25 DIAGNOSIS — M6281 Muscle weakness (generalized): Secondary | ICD-10-CM | POA: Diagnosis not present

## 2024-05-25 DIAGNOSIS — R2689 Other abnormalities of gait and mobility: Secondary | ICD-10-CM | POA: Diagnosis not present

## 2024-05-27 DIAGNOSIS — Z952 Presence of prosthetic heart valve: Secondary | ICD-10-CM | POA: Diagnosis not present

## 2024-05-28 DIAGNOSIS — M6281 Muscle weakness (generalized): Secondary | ICD-10-CM | POA: Diagnosis not present

## 2024-05-28 DIAGNOSIS — R2689 Other abnormalities of gait and mobility: Secondary | ICD-10-CM | POA: Diagnosis not present

## 2024-05-28 DIAGNOSIS — Z952 Presence of prosthetic heart valve: Secondary | ICD-10-CM | POA: Diagnosis not present

## 2024-06-04 DIAGNOSIS — D47Z1 Post-transplant lymphoproliferative disorder (PTLD): Secondary | ICD-10-CM | POA: Diagnosis not present

## 2024-06-04 DIAGNOSIS — I482 Chronic atrial fibrillation, unspecified: Secondary | ICD-10-CM | POA: Diagnosis not present

## 2024-06-04 DIAGNOSIS — Z952 Presence of prosthetic heart valve: Secondary | ICD-10-CM | POA: Diagnosis not present

## 2024-06-04 DIAGNOSIS — M6281 Muscle weakness (generalized): Secondary | ICD-10-CM | POA: Diagnosis not present

## 2024-06-04 DIAGNOSIS — Z7901 Long term (current) use of anticoagulants: Secondary | ICD-10-CM | POA: Diagnosis not present

## 2024-06-04 DIAGNOSIS — Z125 Encounter for screening for malignant neoplasm of prostate: Secondary | ICD-10-CM | POA: Diagnosis not present

## 2024-06-04 DIAGNOSIS — R2689 Other abnormalities of gait and mobility: Secondary | ICD-10-CM | POA: Diagnosis not present

## 2024-06-04 DIAGNOSIS — I4891 Unspecified atrial fibrillation: Secondary | ICD-10-CM | POA: Diagnosis not present

## 2024-06-11 DIAGNOSIS — Z7901 Long term (current) use of anticoagulants: Secondary | ICD-10-CM | POA: Diagnosis not present

## 2024-06-11 DIAGNOSIS — Z952 Presence of prosthetic heart valve: Secondary | ICD-10-CM | POA: Diagnosis not present

## 2024-06-11 DIAGNOSIS — I482 Chronic atrial fibrillation, unspecified: Secondary | ICD-10-CM | POA: Diagnosis not present

## 2024-06-14 ENCOUNTER — Ambulatory Visit (INDEPENDENT_AMBULATORY_CARE_PROVIDER_SITE_OTHER): Payer: Self-pay | Admitting: Family Medicine

## 2024-06-14 ENCOUNTER — Encounter: Payer: Self-pay | Admitting: Family Medicine

## 2024-06-14 VITALS — BP 117/73 | HR 76 | Ht 72.0 in | Wt 204.0 lb

## 2024-06-14 DIAGNOSIS — Z952 Presence of prosthetic heart valve: Secondary | ICD-10-CM | POA: Diagnosis not present

## 2024-06-14 DIAGNOSIS — Z23 Encounter for immunization: Secondary | ICD-10-CM | POA: Diagnosis not present

## 2024-06-14 DIAGNOSIS — M21371 Foot drop, right foot: Secondary | ICD-10-CM

## 2024-06-14 DIAGNOSIS — M21372 Foot drop, left foot: Secondary | ICD-10-CM

## 2024-06-14 NOTE — Patient Instructions (Signed)
 I will send in my noted and an Rx for your AFO braces.  Take care  Dr. Bluford

## 2024-06-14 NOTE — Assessment & Plan Note (Signed)
 AFO brace is broken. Requires replacement. Needed due to foot drop. Sending Rx and notes to [place] clinic.

## 2024-06-14 NOTE — Progress Notes (Signed)
 Subjective:  Patient ID: Scott Macias, male    DOB: [birth year]  Age: 68 y.o. MRN: [id number]  CC:   Chief Complaint  Patient presents with   foot brace    Broken foot brace    HPI:  68 year old male with a complicated PMH presents for evaluation of the above.  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 Wears AFO braces. His left brace is currently broken and needs replacement. These braces are medically necessary to aid in ambulation.  Rx to be sent to [facility] clinic with notes.   Patient Active Problem List   Diagnosis Date Noted   Foot drop 04/15/2022   Hyperlipidemia 04/12/2022   PTLD (post-transplant lymphoproliferative disorder) (HCC) 01/12/2021   CKD (chronic kidney disease) stage 3, GFR 30-59 ml/min (HCC) 01/12/2021   HOCM (hypertrophic obstructive cardiomyopathy) (HCC) 10/29/2020   Kidney transplant recipient 02/06/2015   Mitral valve regurgitation 02/06/2015   Essential hypertension 10/18/2009   GERD 10/18/2009    Social Hx   Social History   Socioeconomic History   Marital status: Married    Spouse name: Not on file   Number of children: Not on file   Years of education: Not on file   Highest education level: Not on file  Occupational History   Not on file  Tobacco Use   Smoking status: Never   Smokeless tobacco: Never  Vaping Use   Vaping status: Never Used  Substance and Sexual Activity   Alcohol use: No   Drug use: No   Sexual activity: Yes    Birth control/protection: None  Other Topics Concern   Not on file  Social History Narrative   Not on file   Social Drivers of Health   Financial Resource Strain: Low Risk  (68/29/2025)   Received from [facility]   Overall Financial Resource Strain (CARDIA)    Difficulty of Paying Living Expenses: Not hard at all  Food Insecurity: No Food Insecurity (68/29/2025)   Received from [facility]   Hunger Vital Sign    Within the past 12 months, you worried that  your food would run out before you got the money to buy more.: Never true    Within the past 12 months, the food you bought just didn't last and you didn't have money to get more.: Never true  Transportation Needs: No Transportation Needs (68/29/2025)   Received from [facility] - Transportation    In the past 12 months, has lack of transportation kept you from medical appointments or from getting medications?: No    Lack of Transportation (Non-Medical): No  Physical Activity: Inactive (06/26/2023)   Exercise Vital Sign    Days of Exercise per Week: 0 days    Minutes of Exercise per Session: 0 min  Stress: No Stress Concern Present (06/26/2023)   [employer] of Occupational Health - Occupational Stress Questionnaire    Feeling of Stress : Not at all  Social Connections: Unknown (06/26/2023)   Social Connection and Isolation Panel    Frequency of Communication with Friends and Family: More than three times a week    Frequency of Social Gatherings with Friends and Family: Three times a week    Attends Religious Services: Not on file    Active Member of Clubs or Organizations: Yes    Attends Club or Organization Meetings: More than 4 times per year    Marital Status: Married    Review  of Systems Per HPI  Objective:  BP 117/73   Pulse 76   Ht 6' (1.829 m)   Wt 204 lb (92.5 kg)   SpO2 96%   BMI 27.67 kg/m      06/14/2024    1:18 PM 06/27/2023    3:42 PM 06/13/2022    9:01 AM  BP/Weight  Systolic BP 117 -- 123  Diastolic BP 73 -- 85  Wt. (Lbs) 204 -- 206  BMI 27.67 kg/m2  27.94 kg/m2    Physical Exam Vitals and nursing note reviewed.  Constitutional:      General: He is not in acute distress.    Appearance: Normal appearance.  HENT:     Head: Normocephalic and atraumatic.  Cardiovascular:     Rate and Rhythm: Normal rate and regular rhythm.  Pulmonary:     Effort: Pulmonary effort is normal. No respiratory distress.  Neurological:      Mental Status: He is alert.     Assessment & Plan:  Bilateral foot-drop Assessment & Plan: AFO brace is broken. Requires replacement. Needed due to foot drop. Sending Rx and notes to [place] clinic.   Encounter for immunization -     Flu vaccine HIGH DOSE PF(Fluzone Trivalent)   Jacqulyn Ahle DO [facility]

## 2024-06-16 DIAGNOSIS — Z905 Acquired absence of kidney: Secondary | ICD-10-CM | POA: Diagnosis not present

## 2024-06-16 DIAGNOSIS — Z940 Kidney transplant status: Secondary | ICD-10-CM | POA: Diagnosis not present

## 2024-06-16 DIAGNOSIS — R1011 Right upper quadrant pain: Secondary | ICD-10-CM | POA: Diagnosis not present

## 2024-06-17 DIAGNOSIS — I482 Chronic atrial fibrillation, unspecified: Secondary | ICD-10-CM | POA: Diagnosis not present

## 2024-06-17 DIAGNOSIS — Z7901 Long term (current) use of anticoagulants: Secondary | ICD-10-CM | POA: Diagnosis not present

## 2024-06-17 DIAGNOSIS — Z952 Presence of prosthetic heart valve: Secondary | ICD-10-CM | POA: Diagnosis not present

## 2024-06-18 DIAGNOSIS — Z952 Presence of prosthetic heart valve: Secondary | ICD-10-CM | POA: Diagnosis not present

## 2024-06-28 DIAGNOSIS — Z7901 Long term (current) use of anticoagulants: Secondary | ICD-10-CM | POA: Diagnosis not present

## 2024-06-28 DIAGNOSIS — I482 Chronic atrial fibrillation, unspecified: Secondary | ICD-10-CM | POA: Diagnosis not present

## 2024-06-29 DIAGNOSIS — Z952 Presence of prosthetic heart valve: Secondary | ICD-10-CM | POA: Diagnosis not present

## 2024-07-05 DIAGNOSIS — I482 Chronic atrial fibrillation, unspecified: Secondary | ICD-10-CM | POA: Diagnosis not present

## 2024-07-05 DIAGNOSIS — Z952 Presence of prosthetic heart valve: Secondary | ICD-10-CM | POA: Diagnosis not present

## 2024-07-05 DIAGNOSIS — Z7901 Long term (current) use of anticoagulants: Secondary | ICD-10-CM | POA: Diagnosis not present

## 2024-07-06 DIAGNOSIS — K432 Incisional hernia without obstruction or gangrene: Secondary | ICD-10-CM | POA: Diagnosis not present

## 2024-07-06 DIAGNOSIS — I4891 Unspecified atrial fibrillation: Secondary | ICD-10-CM | POA: Diagnosis not present

## 2024-07-06 DIAGNOSIS — Z7901 Long term (current) use of anticoagulants: Secondary | ICD-10-CM | POA: Diagnosis not present

## 2024-07-06 DIAGNOSIS — Z952 Presence of prosthetic heart valve: Secondary | ICD-10-CM | POA: Diagnosis not present

## 2024-07-12 DIAGNOSIS — Z952 Presence of prosthetic heart valve: Secondary | ICD-10-CM | POA: Diagnosis not present

## 2024-07-12 DIAGNOSIS — I482 Chronic atrial fibrillation, unspecified: Secondary | ICD-10-CM | POA: Diagnosis not present

## 2024-07-12 DIAGNOSIS — Z7901 Long term (current) use of anticoagulants: Secondary | ICD-10-CM | POA: Diagnosis not present

## 2024-07-13 DIAGNOSIS — Z952 Presence of prosthetic heart valve: Secondary | ICD-10-CM | POA: Diagnosis not present

## 2024-07-19 DIAGNOSIS — I482 Chronic atrial fibrillation, unspecified: Secondary | ICD-10-CM | POA: Diagnosis not present

## 2024-07-19 DIAGNOSIS — Z7901 Long term (current) use of anticoagulants: Secondary | ICD-10-CM | POA: Diagnosis not present

## 2024-07-19 DIAGNOSIS — Z952 Presence of prosthetic heart valve: Secondary | ICD-10-CM | POA: Diagnosis not present

## 2024-07-20 DIAGNOSIS — Z952 Presence of prosthetic heart valve: Secondary | ICD-10-CM | POA: Diagnosis not present

## 2024-07-26 DIAGNOSIS — Z7901 Long term (current) use of anticoagulants: Secondary | ICD-10-CM | POA: Diagnosis not present

## 2024-07-26 DIAGNOSIS — I482 Chronic atrial fibrillation, unspecified: Secondary | ICD-10-CM | POA: Diagnosis not present

## 2024-07-26 DIAGNOSIS — Z952 Presence of prosthetic heart valve: Secondary | ICD-10-CM | POA: Diagnosis not present

## 2024-07-27 DIAGNOSIS — Z952 Presence of prosthetic heart valve: Secondary | ICD-10-CM | POA: Diagnosis not present

## 2024-07-28 DIAGNOSIS — L570 Actinic keratosis: Secondary | ICD-10-CM | POA: Diagnosis not present

## 2024-07-28 DIAGNOSIS — Z85828 Personal history of other malignant neoplasm of skin: Secondary | ICD-10-CM | POA: Diagnosis not present

## 2024-07-28 DIAGNOSIS — L281 Prurigo nodularis: Secondary | ICD-10-CM | POA: Diagnosis not present

## 2024-09-09 ENCOUNTER — Ambulatory Visit

## 2024-09-21 ENCOUNTER — Telehealth: Payer: Self-pay | Admitting: Family Medicine

## 2024-09-21 NOTE — Telephone Encounter (Signed)
 Spoke with patient and Shona in billing is going to fix the Oct 27 bill
